# Patient Record
Sex: Female | Born: 1955 | Race: White | Hispanic: No | Marital: Married | Smoking: Former smoker
Health system: Southern US, Community
[De-identification: ages and names within clinical notes are randomized; demographics above are authoritative.]

## PROBLEM LIST (undated history)

## (undated) DIAGNOSIS — K219 Gastro-esophageal reflux disease without esophagitis: Secondary | ICD-10-CM

## (undated) DIAGNOSIS — R079 Chest pain, unspecified: Secondary | ICD-10-CM

## (undated) DIAGNOSIS — E663 Overweight: Secondary | ICD-10-CM

## (undated) DIAGNOSIS — Z79899 Other long term (current) drug therapy: Secondary | ICD-10-CM

## (undated) DIAGNOSIS — E785 Hyperlipidemia, unspecified: Secondary | ICD-10-CM

## (undated) DIAGNOSIS — Z720 Tobacco use: Secondary | ICD-10-CM

## (undated) DIAGNOSIS — I2510 Atherosclerotic heart disease of native coronary artery without angina pectoris: Secondary | ICD-10-CM

## (undated) DIAGNOSIS — I714 Abdominal aortic aneurysm, without rupture: Secondary | ICD-10-CM

## (undated) DIAGNOSIS — E109 Type 1 diabetes mellitus without complications: Secondary | ICD-10-CM

## (undated) DIAGNOSIS — R32 Unspecified urinary incontinence: Secondary | ICD-10-CM

## (undated) DIAGNOSIS — I10 Essential (primary) hypertension: Secondary | ICD-10-CM

## (undated) DIAGNOSIS — J449 Chronic obstructive pulmonary disease, unspecified: Secondary | ICD-10-CM

## (undated) DIAGNOSIS — Z9989 Dependence on other enabling machines and devices: Secondary | ICD-10-CM

## (undated) DIAGNOSIS — E669 Obesity, unspecified: Secondary | ICD-10-CM

## (undated) DIAGNOSIS — E538 Deficiency of other specified B group vitamins: Secondary | ICD-10-CM

## (undated) DIAGNOSIS — E039 Hypothyroidism, unspecified: Secondary | ICD-10-CM

## (undated) DIAGNOSIS — R5383 Other fatigue: Secondary | ICD-10-CM

## (undated) DIAGNOSIS — G2581 Restless legs syndrome: Secondary | ICD-10-CM

## (undated) DIAGNOSIS — F17200 Nicotine dependence, unspecified, uncomplicated: Secondary | ICD-10-CM

## (undated) DIAGNOSIS — J45909 Unspecified asthma, uncomplicated: Secondary | ICD-10-CM

## (undated) DIAGNOSIS — F341 Dysthymic disorder: Secondary | ICD-10-CM

## (undated) DIAGNOSIS — E559 Vitamin D deficiency, unspecified: Secondary | ICD-10-CM

## (undated) DIAGNOSIS — F330 Major depressive disorder, recurrent, mild: Secondary | ICD-10-CM

## (undated) DIAGNOSIS — Z794 Long term (current) use of insulin: Secondary | ICD-10-CM

## (undated) DIAGNOSIS — H6983 Other specified disorders of Eustachian tube, bilateral: Secondary | ICD-10-CM

## (undated) DIAGNOSIS — H60543 Acute eczematoid otitis externa, bilateral: Secondary | ICD-10-CM

## (undated) DIAGNOSIS — M159 Polyosteoarthritis, unspecified: Secondary | ICD-10-CM

## (undated) DIAGNOSIS — E1143 Type 2 diabetes mellitus with diabetic autonomic (poly)neuropathy: Secondary | ICD-10-CM

## (undated) DIAGNOSIS — G4733 Obstructive sleep apnea (adult) (pediatric): Secondary | ICD-10-CM

## (undated) DIAGNOSIS — M5137 Other intervertebral disc degeneration, lumbosacral region: Secondary | ICD-10-CM

## (undated) HISTORY — DX: Vitamin D deficiency, unspecified: E55.9

## (undated) HISTORY — DX: Obstructive sleep apnea (adult) (pediatric): G47.33

## (undated) HISTORY — DX: Other specified disorders of eustachian tube, bilateral: H69.83

## (undated) HISTORY — DX: Type 2 diabetes mellitus with diabetic autonomic (poly)neuropathy: E11.43

## (undated) HISTORY — PX: CHOLECYSTECTOMY: SHX55

## (undated) HISTORY — PX: ENDOSCOPIC HEMILAMINOTOMY W/ DISCECTOMY LUMBAR: SUR439

## (undated) HISTORY — DX: Dysthymic disorder: F34.1

## (undated) HISTORY — DX: Acute eczematoid otitis externa, bilateral: H60.543

## (undated) HISTORY — DX: Chest pain, unspecified: R07.9

## (undated) HISTORY — DX: Other long term (current) drug therapy: Z79.899

## (undated) HISTORY — DX: Restless legs syndrome: G25.81

## (undated) HISTORY — DX: Other fatigue: R53.83

## (undated) HISTORY — DX: Hypercalcemia: E83.52

## (undated) HISTORY — DX: Gastro-esophageal reflux disease without esophagitis: K21.9

## (undated) HISTORY — DX: Essential (primary) hypertension: I10

## (undated) HISTORY — DX: Abdominal aortic aneurysm, without rupture: I71.4

## (undated) HISTORY — DX: Major depressive disorder, recurrent, mild: F33.0

## (undated) HISTORY — DX: Unspecified urinary incontinence: R32

## (undated) HISTORY — DX: Polyosteoarthritis, unspecified: M15.9

## (undated) HISTORY — DX: Other intervertebral disc degeneration, lumbosacral region: M51.37

## (undated) HISTORY — DX: Hypothyroidism, unspecified: E03.9

## (undated) HISTORY — DX: Nicotine dependence, unspecified, uncomplicated: F17.200

## (undated) HISTORY — DX: Unspecified asthma, uncomplicated: J45.909

## (undated) HISTORY — PX: TUBAL LIGATION: SHX77

## (undated) HISTORY — DX: Hyperlipidemia, unspecified: E78.5

## (undated) HISTORY — DX: Dependence on other enabling machines and devices: Z99.89

## (undated) HISTORY — DX: Type 1 diabetes mellitus without complications: E10.9

## (undated) HISTORY — DX: Deficiency of other specified B group vitamins: E53.8

## (undated) HISTORY — DX: Obesity, unspecified: E66.9

## (undated) HISTORY — DX: Long term (current) use of insulin: Z79.4

## (undated) HISTORY — DX: Atherosclerotic heart disease of native coronary artery without angina pectoris: I25.10

## (undated) HISTORY — DX: Overweight: E66.3

## (undated) HISTORY — DX: Tobacco use: Z72.0

## (undated) HISTORY — DX: Chronic obstructive pulmonary disease, unspecified: J44.9

---

## 2002-09-16 ENCOUNTER — Inpatient Hospital Stay (HOSPITAL_COMMUNITY): Admission: EM | Admit: 2002-09-16 | Discharge: 2002-09-18 | Payer: Self-pay | Admitting: *Deleted

## 2002-09-23 ENCOUNTER — Encounter: Payer: Self-pay | Admitting: Emergency Medicine

## 2002-09-23 ENCOUNTER — Emergency Department (HOSPITAL_COMMUNITY): Admission: EM | Admit: 2002-09-23 | Discharge: 2002-09-23 | Payer: Self-pay | Admitting: Emergency Medicine

## 2003-09-04 ENCOUNTER — Inpatient Hospital Stay (HOSPITAL_COMMUNITY): Admission: EM | Admit: 2003-09-04 | Discharge: 2003-09-07 | Payer: Self-pay | Admitting: Emergency Medicine

## 2003-10-02 ENCOUNTER — Inpatient Hospital Stay (HOSPITAL_COMMUNITY): Admission: EM | Admit: 2003-10-02 | Discharge: 2003-10-04 | Payer: Self-pay

## 2004-11-03 ENCOUNTER — Ambulatory Visit: Payer: Self-pay | Admitting: *Deleted

## 2005-05-04 ENCOUNTER — Ambulatory Visit: Payer: Self-pay

## 2005-05-25 ENCOUNTER — Ambulatory Visit: Payer: Self-pay | Admitting: Cardiology

## 2006-11-18 ENCOUNTER — Ambulatory Visit: Payer: Self-pay | Admitting: Cardiology

## 2006-11-18 ENCOUNTER — Emergency Department (HOSPITAL_COMMUNITY): Admission: EM | Admit: 2006-11-18 | Discharge: 2006-11-18 | Payer: Self-pay | Admitting: Emergency Medicine

## 2006-11-24 ENCOUNTER — Ambulatory Visit: Payer: Self-pay

## 2006-11-25 ENCOUNTER — Ambulatory Visit: Payer: Self-pay

## 2006-12-02 ENCOUNTER — Ambulatory Visit: Payer: Self-pay | Admitting: Cardiovascular Disease

## 2007-03-28 DIAGNOSIS — I10 Essential (primary) hypertension: Secondary | ICD-10-CM | POA: Insufficient documentation

## 2007-03-28 DIAGNOSIS — E109 Type 1 diabetes mellitus without complications: Secondary | ICD-10-CM

## 2007-03-28 DIAGNOSIS — E785 Hyperlipidemia, unspecified: Secondary | ICD-10-CM

## 2007-03-28 DIAGNOSIS — I2510 Atherosclerotic heart disease of native coronary artery without angina pectoris: Secondary | ICD-10-CM

## 2007-03-28 DIAGNOSIS — E039 Hypothyroidism, unspecified: Secondary | ICD-10-CM

## 2007-03-28 HISTORY — DX: Type 1 diabetes mellitus without complications: E10.9

## 2007-03-28 HISTORY — DX: Atherosclerotic heart disease of native coronary artery without angina pectoris: I25.10

## 2007-03-28 HISTORY — DX: Hypothyroidism, unspecified: E03.9

## 2007-03-28 HISTORY — DX: Essential (primary) hypertension: I10

## 2007-03-28 HISTORY — DX: Hyperlipidemia, unspecified: E78.5

## 2009-08-27 HISTORY — PX: CORONARY ANGIOPLASTY WITH STENT PLACEMENT: SHX49

## 2009-08-27 HISTORY — PX: CARDIAC CATHETERIZATION: SHX172

## 2009-09-02 ENCOUNTER — Ambulatory Visit: Payer: Self-pay | Admitting: Cardiology

## 2009-09-02 ENCOUNTER — Inpatient Hospital Stay (HOSPITAL_COMMUNITY): Admission: EM | Admit: 2009-09-02 | Discharge: 2009-09-04 | Payer: Self-pay | Admitting: Cardiology

## 2009-09-16 ENCOUNTER — Telehealth (INDEPENDENT_AMBULATORY_CARE_PROVIDER_SITE_OTHER): Payer: Self-pay | Admitting: *Deleted

## 2009-09-17 ENCOUNTER — Ambulatory Visit: Payer: Self-pay | Admitting: Cardiology

## 2009-09-17 ENCOUNTER — Ambulatory Visit: Payer: Self-pay

## 2009-09-17 ENCOUNTER — Encounter (HOSPITAL_COMMUNITY): Admission: RE | Admit: 2009-09-17 | Discharge: 2009-11-27 | Payer: Self-pay | Admitting: Cardiovascular Disease

## 2009-09-18 ENCOUNTER — Encounter: Payer: Self-pay | Admitting: Cardiology

## 2009-09-18 ENCOUNTER — Ambulatory Visit: Payer: Self-pay | Admitting: Internal Medicine

## 2009-09-18 DIAGNOSIS — E663 Overweight: Secondary | ICD-10-CM

## 2009-09-18 DIAGNOSIS — R079 Chest pain, unspecified: Secondary | ICD-10-CM

## 2009-09-18 HISTORY — DX: Chest pain, unspecified: R07.9

## 2009-09-18 HISTORY — DX: Overweight: E66.3

## 2009-10-31 DIAGNOSIS — Z720 Tobacco use: Secondary | ICD-10-CM

## 2009-10-31 DIAGNOSIS — F17200 Nicotine dependence, unspecified, uncomplicated: Secondary | ICD-10-CM

## 2009-10-31 DIAGNOSIS — K219 Gastro-esophageal reflux disease without esophagitis: Secondary | ICD-10-CM | POA: Insufficient documentation

## 2009-10-31 HISTORY — DX: Tobacco use: Z72.0

## 2009-10-31 HISTORY — DX: Gastro-esophageal reflux disease without esophagitis: K21.9

## 2009-10-31 HISTORY — DX: Nicotine dependence, unspecified, uncomplicated: F17.200

## 2009-11-15 ENCOUNTER — Telehealth (INDEPENDENT_AMBULATORY_CARE_PROVIDER_SITE_OTHER): Payer: Self-pay | Admitting: *Deleted

## 2009-12-04 ENCOUNTER — Telehealth (INDEPENDENT_AMBULATORY_CARE_PROVIDER_SITE_OTHER): Payer: Self-pay | Admitting: *Deleted

## 2009-12-04 ENCOUNTER — Ambulatory Visit: Payer: Self-pay | Admitting: Cardiovascular Disease

## 2009-12-10 ENCOUNTER — Telehealth: Payer: Self-pay | Admitting: Cardiovascular Disease

## 2009-12-12 ENCOUNTER — Telehealth: Payer: Self-pay | Admitting: Cardiovascular Disease

## 2009-12-20 ENCOUNTER — Telehealth: Payer: Self-pay | Admitting: Cardiovascular Disease

## 2010-04-18 ENCOUNTER — Ambulatory Visit: Payer: Self-pay | Admitting: Cardiovascular Disease

## 2010-05-13 ENCOUNTER — Telehealth: Payer: Self-pay | Admitting: Cardiovascular Disease

## 2010-07-29 NOTE — Progress Notes (Signed)
Summary: Nuclear pre procedure  Phone Note Outgoing Call Call back at [facility] Phone [phone]   Call placed by: Rea College, CMA,  September 16, 2009 3:45 PM Call placed to: Patient Summary of Call: Attempted to call patient with instructions for myoview, but their phone voicemail has not been set up yet.

## 2010-07-29 NOTE — Assessment & Plan Note (Signed)
Summary: Cardiology Nuclear Study  Nuclear Med Background Indications for Stress Test: Evaluation for Ischemia, Stent Patency, PTCA Patency, Post Hospital  Indications Comments: 09/02/09-09/04/09 CP, NSTEMI>PTCA/Stent-RCA.  History: Angioplasty, Heart Catheterization, Myocardial Infarction, Myocardial Perfusion Study, Stents  History Comments: '04 IPWMI>PTCA/Stent-RCA x 3; '08 MPS: normal @ only 78% of PMHR, EF=76%; 09/03/09 PTAC/Stent-RCA, EF=60%; h/o OSA  Symptoms: Chest Pain, Diaphoresis, Dizziness, DOE, Fatigue, Palpitations, Rapid HR, SOB  Symptoms Comments: Last episode of ZO:XWRUEAVW, now, 1/10.   Nuclear Pre-Procedure Cardiac Risk Factors: Family History - CAD, History of Smoking, Hypertension, IDDM Type 1, Lipids, Obesity Caffeine/Decaff Intake: None NPO After: 8:00 PM Lungs: Clear.  O2 Sat 96% on RA. IV 0.9% NS with Angio Cath: 20g     IV Site: (R) AC IV Started by: Irean Hong RN Chest Size (in) 44     Cup Size DD     Height (in): 64.5 Weight (lb): 225 BMI: 38.16 Tech Comments: Patient recently quit smoking x 2 weeks  Nuclear Med Study 1 or 2 day study:  1 day     Stress Test Type:  Eugenie Birks Reading MD:  Willa Rough, MD     Referring MD:  Tonny Bollman, MD Resting Radionuclide:  Technetium 75m Tetrofosmin     Resting Radionuclide Dose:  11 mCi  Stress Radionuclide:  Technetium 34m Tetrofosmin     Stress Radionuclide Dose:  33 mCi   Stress Protocol   Lexiscan: 0.4 mg   Stress Test Technologist:  Rea College CMA-N     Nuclear Technologist:  Domenic Polite CNMT  Rest Procedure  Myocardial perfusion imaging was performed at rest 45 minutes following the intravenous administration of Myoview Technetium 74m Tetrofosmin.  Stress Procedure  The patient received IV Lexiscan 0.4 mg over 15-seconds.  Myoview injected at 30-seconds.  There were no significant changes with infusion.  She did c/o neck tightness with lexiscan.  Quantitative spect images were obtained after a  45 minute delay.  QPS Raw Data Images:  Normal; no motion artifact; normal heart/lung ratio. Stress Images:  There is normal uptake in all areas. Rest Images:  Normal homogeneous uptake in all areas of the myocardium. Subtraction (SDS):  No evidence of ischemia. Transient Ischemic Dilatation:  1.06  (Normal <1.22)  Lung/Heart Ratio:  .38  (Normal <0.45)  Quantitative Gated Spect Images QGS EDV:  82 ml QGS ESV:  29 ml QGS EF:  65 % QGS cine images:  Normal motion  Findings Normal nuclear study      Overall Impression  Exercise Capacity: Lexiscan BP Response: Normal blood pressure response. Clinical Symptoms: neck tight ECG Impression: No significant ST segment change suggestive of ischemia. Overall Impression: Normal stress nuclear study.  Appended Document: Cardiology Nuclear Study please notify pt of normal result. thx.  Appended Document: Cardiology Nuclear Study Pt was seen by the PA on 09/18/09.  Herma Carson reviewed results with pt at that time.

## 2010-07-29 NOTE — Progress Notes (Signed)
Summary: pres refill  Phone Note Call from Patient   Summary of Call: Dr. Freida Busman increased her Lasix and she needs a new [facility] [address] Initial call taken by: Park Breed,  December 12, 2009 3:54 PM  Follow-up for Phone Call        Rx Called In:  Lasix 40mg  two times a day [facility] [address] Rosalita Levan per patient's request after med was increased by Dr. Freida Busman. Follow-up by: Dessie Coma,  December 12, 2009 4:01 PM

## 2010-07-29 NOTE — Assessment & Plan Note (Signed)
Summary: eph   Visit Type:  Follow-up  CC:  sob and chest pain.  History of Present Illness: This is a a 55 yr. old white female patient who had a recent hospitalization for  ST segment elevation MI September 02, 2009. She underwent drug-eluting stent to the distal RCA for in-stent restenosis. She had a residual 70% LAD and normal LV function. Prior to that she had a bare-metal stent placed to the mid distal RCA in 2005. She had been on Plavix and had PTY12 inhibition assay, and was found to have zero platelet inhibition on Plavix. For this reason, she was switched to Effient.  yesterday she had a Lexus scan that was completely normal.  Since she's been home she complains of a soreness in her chest that is chronic and doesn't go away. She, says it's not the same pain that brought her into the hospital. She has had worsening depression and these medications have been adjusted. She has also been unable to start cardiac rehabilitation for various reasons and has a trip planned to [place], for her son's wedding. she complains of dyspnea on exertion as well.    Current Medications (verified): 1)  Aspirin 325 Mg  Tbec (Aspirin) .... Take 1 By Mouth Qd 2)  Multivitamins   Tabs (Multiple Vitamin) .... Take 1 By Mouth Qd 3)  Spironolactone-Hctz 25-25 Mg  Tabs (Spironolactone-Hctz) .... Take 11/2 Tab By Mouth Qd 4)  Cymbalta 60 Mg  Cpep (Duloxetine Hcl) .... Take 1 By Mouth Two Times A Day 5)  Toprol Xl 100 Mg  Tb24 (Metoprolol Succinate) .... Take 1 By Mouth Qd 6)  Lipitor 80 Mg  Tabs (Atorvastatin Calcium) .... Take 1 By Mouth Qd 7)  Furosemide 20 Mg  Tabs (Furosemide) .... Take 1 Two Times A Day 8)  Fenofibrate 54 Mg Tabs (Fenofibrate) .... Take One Daily 9)  Omeprazole 20 Mg Cpdr (Omeprazole) .... Take Two Two Times A Day 10)  Lantus 100 Unit/ml  Soln (Insulin Glargine) .... See Office Notes 11)  Folic Acid 200 Mcg  Tabs (Folic Acid) .... Take 1 By Mouth Qd 12)  Synthroid 100 Mcg  Tabs (Levothyroxine  Sodium) .... Take 1 By Mouth Qd 13)  Synthroid 88 Mcg  Tabs (Levothyroxine Sodium) .... Take 1 By Mouth Qd 14)  Est Estrogens-Methyltest 1.25-2.5 Mg Tabs (Est Estrogens-Methyltest) .... Take One Daily 15)  Medroxyprogesterone Acetate 10 Mg Tabs (Medroxyprogesterone Acetate) .... Take One Daily 16)  Clonazepam 0.5 Mg Tabs (Clonazepam) .... Take 1 1/2 in Evening 17)  Tylenol Arthritis Pain 650 Mg Cr-Tabs (Acetaminophen) .... Take Two in The Evening 18)  Nitrostat 0.4 Mg Subl (Nitroglycerin) .... Take As Needed 19)  Lantus 100 Unit/ml Soln (Insulin Glargine) .... Take As Directed 20)  Vitamin D 1000 Unit Tabs (Cholecalciferol) .... Take Two Daily 21)  Effient 10 Mg Tabs (Prasugrel Hcl) .... Take One Daily 22)  Abilify 2 Mg Tabs (Aripiprazole) .... Take One Daily 23)  Triamcinolone Acetonide 0.1 % Crea (Triamcinolone Acetonide) .... Apply Gently To Area 3 To 4 Times Daily 24)  Proair Hfa 108 (90 Base) Mcg/act Aers (Albuterol Sulfate) .... 2 Puffs Every 4 Hours Prn 25)  Hydrocodone-Acetaminophen 7.5-500 Mg Tabs (Hydrocodone-Acetaminophen) .... Take Every 4-6 Hours As Needed  Allergies: 1)  ! Sulfa 2)  ! Amoxicillin 3)  ! Codeine  Past History:  Past Medical History: Last updated: 03/28/2007 Coronary artery disease Diabetes mellitus, type I Hyperlipidemia Hypertension Hypothyroidism  Review of Systems  see the history of present illness  Vital Signs:  Patient profile:   55 year old female Height:      64.5 inches Weight:      224 pounds Pulse rate:   96 / minute Pulse rhythm:   regular BP sitting:   128 / 76  (left arm)  Vitals Entered By: Jacquelin Hawking, CMA (September 18, 2009 1:32 PM)  Physical Exam  General:   Well-nournished, in no acute distress. Neck: No JVD, HJR, Bruit, or thyroid enlargement Lungs: No tachypnea, clear without wheezing, rales, or rhonchi Cardiovascular: RRR, PMI not displaced, heart sounds normal, no murmurs, gallops, bruit, thrill, or  heave. Abdomen: BS normal. Soft without organomegaly, masses, lesions or tenderness. Extremities: left radial pulse, without hematoma or hemorrhage. She has good brachial pulses, lower extremities without cyanosis, clubbing or edema. Good distal pulses bilateral SKin: Warm, no lesions or rashes  Musculoskeletal: No deformities Neuro: no focal signs    Impression & Recommendations:  Problem # 1:  CORONARY ARTERY DISEASE (ICD-414.00) Patient suffered a non-ST elevation MI September 02, 2009 and underwent successful stenting of the distal RCA for in-stent restenosis. She has residual 70% LAD and normal LV function. Axis scan yesterday was normal without ischemia. She did have 0%, platelet inhibition on Plavix and was therefore, switched to Effient. She has ongoing chest soreness that is continuous, but different from admission pain. I encouraged her cardiac rehabilitation and weight loss. The following medications were removed from the medication list:    Plavix 75 Mg Tabs (Clopidogrel bisulfate) .Marland Kitchen... Take 1 by mouth qd Her updated medication list for this problem includes:    Aspirin 325 Mg Tbec (Aspirin) .Marland Kitchen... Take 1 by mouth qd    Toprol Xl 100 Mg Tb24 (Metoprolol succinate) .Marland Kitchen... Take 1 by mouth qd    Nitrostat 0.4 Mg Subl (Nitroglycerin) .Marland Kitchen... Take as needed    Effient 10 Mg Tabs (Prasugrel hcl) .Marland Kitchen... Take one daily  Problem # 2:  HYPERTENSION (ICD-401.9) blood pressure is controlled Her updated medication list for this problem includes:    Aspirin 325 Mg Tbec (Aspirin) .Marland Kitchen... Take 1 by mouth qd    Spironolactone-hctz 25-25 Mg Tabs (Spironolactone-hctz) .Marland Kitchen... Take 11/2 tab by mouth qd    Toprol Xl 100 Mg Tb24 (Metoprolol succinate) .Marland Kitchen... Take 1 by mouth qd    Furosemide 20 Mg Tabs (Furosemide) .Marland Kitchen... Take 1 two times a day  Patient Instructions: 1)  Your physician recommends that you schedule a follow-up appointment in: 1 month with Dr. Excell Seltzer. 2)  Encouraged cardiac re-habilitation.

## 2010-07-29 NOTE — Progress Notes (Signed)
----   Converted from flag ---- ---- 12/04/2009 1:40 PM, Marilynne Halsted, CMA, AAMA wrote: PT HAS [employer] STANDARD, NO PRECERT REQUIRED.  ---- 12/04/2009 1:36 PM, Dessie Coma wrote: Echo scheduled at [facility]. for Mon. 12/09/09 with Dx: [phone] Thanks, Victorino Dike ------------------------------

## 2010-07-29 NOTE — Progress Notes (Signed)
  Phone Note Outgoing Call   Call placed by: Dessie Coma,  December 20, 2009 11:15 AM Call placed to: Patient Summary of Call: Patient notified per Dr. Freida Busman, Echo was normal.

## 2010-07-29 NOTE — Progress Notes (Signed)
Summary: MED REFILL  Phone Note Call from Patient   Summary of Call: PT NEEDS REFILLS [facility] [address]. Initial call taken by: Park Breed,  May 13, 2010 3:18 PM  Follow-up for Phone Call        Rx Called In Follow-up by: Dessie Coma  LPN,  May 13, 2010 3:38 PM

## 2010-07-29 NOTE — Progress Notes (Signed)
  Request recieved from DDS sent to [facility]  Nov 15, 2009 8:17 AM

## 2010-07-29 NOTE — Progress Notes (Signed)
  Phone Note Outgoing Call   Call placed by: Dessie Coma,  December 10, 2009 9:30 AM Summary of Call: T.C. to patient-notified her per Dr. Freida Busman, to increase her lasix to 40mg  by mouth two times a day. Triglycerides still too high.  Need to continue to work on diet.

## 2010-09-21 LAB — CBC
HCT: 38.4 % (ref 36.0–46.0)
HCT: 40.2 % (ref 36.0–46.0)
HCT: 42 % (ref 36.0–46.0)
Hemoglobin: 13.3 g/dL (ref 12.0–15.0)
Hemoglobin: 13.8 g/dL (ref 12.0–15.0)
Hemoglobin: 14.5 g/dL (ref 12.0–15.0)
MCHC: 34.4 g/dL (ref 30.0–36.0)
MCHC: 34.5 g/dL (ref 30.0–36.0)
MCHC: 34.8 g/dL (ref 30.0–36.0)
MCV: 96 fL (ref 78.0–100.0)
MCV: 96.3 fL (ref 78.0–100.0)
MCV: 96.5 fL (ref 78.0–100.0)
Platelets: 218 10*3/uL (ref 150–400)
Platelets: 230 10*3/uL (ref 150–400)
Platelets: 260 10*3/uL (ref 150–400)
RBC: 3.98 MIL/uL (ref 3.87–5.11)
RBC: 4.18 MIL/uL (ref 3.87–5.11)
RBC: 4.38 MIL/uL (ref 3.87–5.11)
RDW: 14.3 % (ref 11.5–15.5)
RDW: 14.4 % (ref 11.5–15.5)
RDW: 14.5 % (ref 11.5–15.5)
WBC: 13.7 10*3/uL — ABNORMAL HIGH (ref 4.0–10.5)
WBC: 14.3 10*3/uL — ABNORMAL HIGH (ref 4.0–10.5)
WBC: 9.4 10*3/uL (ref 4.0–10.5)

## 2010-09-21 LAB — GLUCOSE, CAPILLARY
Glucose-Capillary: 164 mg/dL — ABNORMAL HIGH (ref 70–99)
Glucose-Capillary: 175 mg/dL — ABNORMAL HIGH (ref 70–99)
Glucose-Capillary: 186 mg/dL — ABNORMAL HIGH (ref 70–99)
Glucose-Capillary: 194 mg/dL — ABNORMAL HIGH (ref 70–99)
Glucose-Capillary: 208 mg/dL — ABNORMAL HIGH (ref 70–99)
Glucose-Capillary: 227 mg/dL — ABNORMAL HIGH (ref 70–99)
Glucose-Capillary: 236 mg/dL — ABNORMAL HIGH (ref 70–99)
Glucose-Capillary: 278 mg/dL — ABNORMAL HIGH (ref 70–99)

## 2010-09-21 LAB — CARDIAC PANEL(CRET KIN+CKTOT+MB+TROPI)
CK, MB: 1.7 ng/mL (ref 0.3–4.0)
CK, MB: 2.3 ng/mL (ref 0.3–4.0)
CK, MB: 2.6 ng/mL (ref 0.3–4.0)
Relative Index: INVALID (ref 0.0–2.5)
Relative Index: INVALID (ref 0.0–2.5)
Relative Index: INVALID (ref 0.0–2.5)
Total CK: 46 U/L (ref 7–177)
Total CK: 50 U/L (ref 7–177)
Total CK: 57 U/L (ref 7–177)
Troponin I: 0.2 ng/mL — ABNORMAL HIGH (ref 0.00–0.06)
Troponin I: 0.24 ng/mL — ABNORMAL HIGH (ref 0.00–0.06)
Troponin I: 0.39 ng/mL — ABNORMAL HIGH (ref 0.00–0.06)

## 2010-09-21 LAB — HEMOGLOBIN A1C
Hgb A1c MFr Bld: 10 % — ABNORMAL HIGH (ref 4.6–6.1)
Mean Plasma Glucose: 240 mg/dL

## 2010-09-21 LAB — PLATELET INHIBITION P2Y12
P2Y12 % Inhibition: 0 %
Platelet Function  P2Y12: 274 [PRU] (ref 194–418)
Platelet Function Baseline: 261 [PRU] (ref 194–418)

## 2010-09-21 LAB — MRSA PCR SCREENING
MRSA by PCR: NEGATIVE
MRSA by PCR: NEGATIVE

## 2010-09-21 LAB — TSH: TSH: 5.978 u[IU]/mL — ABNORMAL HIGH (ref 0.350–4.500)

## 2010-09-21 LAB — BASIC METABOLIC PANEL
BUN: 7 mg/dL (ref 6–23)
CO2: 28 mEq/L (ref 19–32)
Calcium: 8.8 mg/dL (ref 8.4–10.5)
Chloride: 104 mEq/L (ref 96–112)
Creatinine, Ser: 0.74 mg/dL (ref 0.4–1.2)
GFR calc Af Amer: >60 mL/min (ref >60)
GFR calc non Af Amer: >60 mL/min (ref >60)
Glucose, Bld: 167 mg/dL — ABNORMAL HIGH (ref 70–99)
Potassium: 3.4 mEq/L — ABNORMAL LOW (ref 3.5–5.1)
Sodium: 139 mEq/L (ref 135–145)

## 2010-09-21 LAB — LIPID PANEL
Cholesterol: 154 mg/dL (ref 0–200)
HDL: 26 mg/dL — ABNORMAL LOW (ref >39)
LDL Cholesterol: UNDETERMINED mg/dL (ref 0–99)
Total CHOL/HDL Ratio: 5.9 RATIO
Triglycerides: 658 mg/dL — ABNORMAL HIGH (ref <150)
VLDL: UNDETERMINED mg/dL (ref 0–40)

## 2010-09-21 LAB — HEPARIN LEVEL (UNFRACTIONATED)
Heparin Unfractionated: 0.1 IU/mL — ABNORMAL LOW (ref 0.30–0.70)
Heparin Unfractionated: 0.19 IU/mL — ABNORMAL LOW (ref 0.30–0.70)

## 2010-09-21 LAB — PLATELET COUNT: Platelets: 234 10*3/uL (ref 150–400)

## 2010-09-21 LAB — BRAIN NATRIURETIC PEPTIDE: Pro B Natriuretic peptide (BNP): 49 pg/mL (ref 0.0–100.0)

## 2010-09-24 ENCOUNTER — Telehealth: Payer: Self-pay | Admitting: *Deleted

## 2010-09-24 NOTE — Telephone Encounter (Signed)
Patient stopped by office-states was diagnosed with an AAA by her orthopedist.  4.2cm-wants to be seen.  First available appt. 10/06/10-will notify Dr. Kirke Corin to see if patient can wait til then.

## 2010-09-26 NOTE — Telephone Encounter (Signed)
Yes that is fine. It is not a life threatening situation.

## 2010-09-26 NOTE — Telephone Encounter (Signed)
T.C. To patient-notified her,per Dr. Kirke Corin, OK to wait until appointment in April-it is not a life threatening situation.

## 2010-10-01 ENCOUNTER — Encounter: Payer: Self-pay | Admitting: Cardiovascular Disease

## 2010-10-06 ENCOUNTER — Ambulatory Visit: Payer: Self-pay | Admitting: Cardiovascular Disease

## 2010-10-23 ENCOUNTER — Emergency Department (HOSPITAL_COMMUNITY)

## 2010-10-23 ENCOUNTER — Encounter (HOSPITAL_COMMUNITY): Payer: Self-pay | Admitting: Radiology

## 2010-10-23 ENCOUNTER — Inpatient Hospital Stay (HOSPITAL_COMMUNITY)
Admission: EM | Admit: 2010-10-23 | Discharge: 2010-10-24 | DRG: 313 | Disposition: A | Discharge status: Home / Self Care | Source: Ambulatory Visit | Attending: Cardiology | Admitting: Cardiology

## 2010-10-23 DIAGNOSIS — E669 Obesity, unspecified: Secondary | ICD-10-CM | POA: Diagnosis present

## 2010-10-23 DIAGNOSIS — E039 Hypothyroidism, unspecified: Secondary | ICD-10-CM | POA: Diagnosis present

## 2010-10-23 DIAGNOSIS — G4733 Obstructive sleep apnea (adult) (pediatric): Secondary | ICD-10-CM | POA: Diagnosis present

## 2010-10-23 DIAGNOSIS — E785 Hyperlipidemia, unspecified: Secondary | ICD-10-CM | POA: Diagnosis present

## 2010-10-23 DIAGNOSIS — R079 Chest pain, unspecified: Principal | ICD-10-CM | POA: Diagnosis present

## 2010-10-23 DIAGNOSIS — M549 Dorsalgia, unspecified: Secondary | ICD-10-CM | POA: Diagnosis present

## 2010-10-23 DIAGNOSIS — Z7982 Long term (current) use of aspirin: Secondary | ICD-10-CM

## 2010-10-23 DIAGNOSIS — I2510 Atherosclerotic heart disease of native coronary artery without angina pectoris: Secondary | ICD-10-CM | POA: Diagnosis present

## 2010-10-23 DIAGNOSIS — Z794 Long term (current) use of insulin: Secondary | ICD-10-CM

## 2010-10-23 DIAGNOSIS — I252 Old myocardial infarction: Secondary | ICD-10-CM

## 2010-10-23 DIAGNOSIS — I10 Essential (primary) hypertension: Secondary | ICD-10-CM | POA: Diagnosis present

## 2010-10-23 DIAGNOSIS — E119 Type 2 diabetes mellitus without complications: Secondary | ICD-10-CM | POA: Diagnosis present

## 2010-10-23 DIAGNOSIS — K219 Gastro-esophageal reflux disease without esophagitis: Secondary | ICD-10-CM | POA: Diagnosis present

## 2010-10-23 DIAGNOSIS — G2581 Restless legs syndrome: Secondary | ICD-10-CM | POA: Diagnosis present

## 2010-10-23 DIAGNOSIS — Z9861 Coronary angioplasty status: Secondary | ICD-10-CM

## 2010-10-23 DIAGNOSIS — F17200 Nicotine dependence, unspecified, uncomplicated: Secondary | ICD-10-CM | POA: Diagnosis present

## 2010-10-23 DIAGNOSIS — E876 Hypokalemia: Secondary | ICD-10-CM | POA: Diagnosis present

## 2010-10-23 LAB — COMPREHENSIVE METABOLIC PANEL
ALT: 21 U/L (ref 0–35)
AST: 20 U/L (ref 0–37)
Albumin: 4.1 g/dL (ref 3.5–5.2)
Alkaline Phosphatase: 81 U/L (ref 39–117)
BUN: 8 mg/dL (ref 6–23)
CO2: 28 mEq/L (ref 19–32)
Calcium: 9.8 mg/dL (ref 8.4–10.5)
Chloride: 100 mEq/L (ref 96–112)
Creatinine, Ser: 0.98 mg/dL (ref 0.4–1.2)
GFR calc Af Amer: >60 mL/min (ref >60)
GFR calc non Af Amer: 59 mL/min — ABNORMAL LOW (ref >60)
Glucose, Bld: 98 mg/dL (ref 70–99)
Potassium: 3.2 mEq/L — ABNORMAL LOW (ref 3.5–5.1)
Sodium: 140 mEq/L (ref 135–145)
Total Bilirubin: 0.4 mg/dL (ref 0.3–1.2)
Total Protein: 7.2 g/dL (ref 6.0–8.3)

## 2010-10-23 LAB — POCT I-STAT, CHEM 8
BUN: 8 mg/dL (ref 6–23)
Calcium, Ion: 1.09 mmol/L — ABNORMAL LOW (ref 1.12–1.32)
Chloride: 99 mEq/L (ref 96–112)
Creatinine, Ser: 1.1 mg/dL (ref 0.4–1.2)
Glucose, Bld: 120 mg/dL — ABNORMAL HIGH (ref 70–99)
HCT: 44 % (ref 36.0–46.0)
Hemoglobin: 15 g/dL (ref 12.0–15.0)
Potassium: 3.5 mEq/L (ref 3.5–5.1)
Sodium: 139 mEq/L (ref 135–145)
TCO2: 30 mmol/L (ref 0–100)

## 2010-10-23 LAB — POCT CARDIAC MARKERS
CKMB, poc: <1 ng/mL — ABNORMAL LOW (ref 1.0–8.0)
CKMB, poc: 1 ng/mL (ref 1.0–8.0)
Myoglobin, poc: 59.8 ng/mL (ref 12–200)
Myoglobin, poc: 90.7 ng/mL (ref 12–200)
Troponin i, poc: <0.05 ng/mL (ref 0.00–0.09)
Troponin i, poc: <0.05 ng/mL (ref 0.00–0.09)

## 2010-10-23 LAB — DIFFERENTIAL
Basophils Absolute: 0.1 10*3/uL (ref 0.0–0.1)
Basophils Relative: 1 % (ref 0–1)
Eosinophils Absolute: 0.4 10*3/uL (ref 0.0–0.7)
Eosinophils Relative: 3 % (ref 0–5)
Lymphocytes Relative: 17 % (ref 12–46)
Lymphs Abs: 2.7 10*3/uL (ref 0.7–4.0)
Monocytes Absolute: 0.7 10*3/uL (ref 0.1–1.0)
Monocytes Relative: 5 % (ref 3–12)
Neutro Abs: 11.8 10*3/uL — ABNORMAL HIGH (ref 1.7–7.7)
Neutrophils Relative %: 75 % (ref 43–77)

## 2010-10-23 LAB — HEPATIC FUNCTION PANEL
ALT: 25 U/L (ref 0–35)
AST: 23 U/L (ref 0–37)
Albumin: 3.8 g/dL (ref 3.5–5.2)
Alkaline Phosphatase: 81 U/L (ref 39–117)
Bilirubin, Direct: 0.1 mg/dL (ref 0.0–0.3)
Indirect Bilirubin: 0.2 mg/dL — ABNORMAL LOW (ref 0.3–0.9)
Total Bilirubin: 0.3 mg/dL (ref 0.3–1.2)
Total Protein: 6.7 g/dL (ref 6.0–8.3)

## 2010-10-23 LAB — TROPONIN I: Troponin I: 0.02 ng/mL (ref 0.00–0.06)

## 2010-10-23 LAB — LIPASE, BLOOD: Lipase: 24 U/L (ref 11–59)

## 2010-10-23 LAB — CBC
HCT: 43.7 % (ref 36.0–46.0)
Hemoglobin: 14.7 g/dL (ref 12.0–15.0)
MCH: 32.3 pg (ref 26.0–34.0)
MCHC: 33.6 g/dL (ref 30.0–36.0)
MCV: 96 fL (ref 78.0–100.0)
Platelets: 354 10*3/uL (ref 150–400)
RBC: 4.55 MIL/uL (ref 3.87–5.11)
RDW: 15.2 % (ref 11.5–15.5)
WBC: 15.7 10*3/uL — ABNORMAL HIGH (ref 4.0–10.5)

## 2010-10-23 LAB — BRAIN NATRIURETIC PEPTIDE: Pro B Natriuretic peptide (BNP): 44 pg/mL (ref 0.0–100.0)

## 2010-10-23 LAB — CK TOTAL AND CKMB (NOT AT ARMC)
CK, MB: 1.8 ng/mL (ref 0.3–4.0)
Relative Index: INVALID (ref 0.0–2.5)
Total CK: 77 U/L (ref 7–177)

## 2010-10-23 LAB — PROTIME-INR
INR: 0.9 (ref 0.00–1.49)
Prothrombin Time: 12.4 seconds (ref 11.6–15.2)

## 2010-10-23 LAB — AMYLASE: Amylase: 20 U/L (ref 0–105)

## 2010-10-23 MED ORDER — IOHEXOL 350 MG/ML SOLN
100.0000 mL | Freq: Once | INTRAVENOUS | Status: AC | PRN
  Administered 2010-10-23: 100 mL via INTRAVENOUS

## 2010-10-24 LAB — CBC
HCT: 41.5 % (ref 36.0–46.0)
Hemoglobin: 13.8 g/dL (ref 12.0–15.0)
MCH: 31.9 pg (ref 26.0–34.0)
MCHC: 33.3 g/dL (ref 30.0–36.0)
MCV: 96.1 fL (ref 78.0–100.0)
Platelets: 329 10*3/uL (ref 150–400)
RBC: 4.32 MIL/uL (ref 3.87–5.11)
RDW: 15.3 % (ref 11.5–15.5)
WBC: 13 10*3/uL — ABNORMAL HIGH (ref 4.0–10.5)

## 2010-10-24 LAB — PROTIME-INR
INR: 0.94 (ref 0.00–1.49)
Prothrombin Time: 12.8 seconds (ref 11.6–15.2)

## 2010-10-24 LAB — CARDIAC PANEL(CRET KIN+CKTOT+MB+TROPI)
CK, MB: 2.3 ng/mL (ref 0.3–4.0)
Relative Index: INVALID (ref 0.0–2.5)
Total CK: 71 U/L (ref 7–177)
Troponin I: 0.01 ng/mL (ref 0.00–0.06)

## 2010-10-24 LAB — BASIC METABOLIC PANEL
BUN: 9 mg/dL (ref 6–23)
CO2: 28 mEq/L (ref 19–32)
Calcium: 9.5 mg/dL (ref 8.4–10.5)
Chloride: 98 mEq/L (ref 96–112)
Creatinine, Ser: 0.93 mg/dL (ref 0.4–1.2)
GFR calc Af Amer: >60 mL/min (ref >60)
GFR calc non Af Amer: >60 mL/min (ref >60)
Glucose, Bld: 207 mg/dL — ABNORMAL HIGH (ref 70–99)
Potassium: 3.3 mEq/L — ABNORMAL LOW (ref 3.5–5.1)
Sodium: 137 mEq/L (ref 135–145)

## 2010-10-24 LAB — GLUCOSE, CAPILLARY
Glucose-Capillary: 189 mg/dL — ABNORMAL HIGH (ref 70–99)
Glucose-Capillary: 171 mg/dL — ABNORMAL HIGH (ref 70–99)

## 2010-10-24 LAB — MRSA PCR SCREENING: MRSA by PCR: NEGATIVE

## 2010-10-24 LAB — APTT: aPTT: 30 seconds (ref 24–37)

## 2010-10-24 LAB — HEPARIN LEVEL (UNFRACTIONATED): Heparin Unfractionated: <0.1 IU/mL — ABNORMAL LOW (ref 0.30–0.70)

## 2010-10-27 ENCOUNTER — Encounter: Admitting: Cardiovascular Disease

## 2010-10-30 ENCOUNTER — Ambulatory Visit (INDEPENDENT_AMBULATORY_CARE_PROVIDER_SITE_OTHER): Admitting: Cardiovascular Disease

## 2010-10-30 ENCOUNTER — Encounter: Payer: Self-pay | Admitting: Cardiovascular Disease

## 2010-10-30 DIAGNOSIS — Z720 Tobacco use: Secondary | ICD-10-CM

## 2010-10-30 DIAGNOSIS — F17200 Nicotine dependence, unspecified, uncomplicated: Secondary | ICD-10-CM

## 2010-10-30 DIAGNOSIS — I2510 Atherosclerotic heart disease of native coronary artery without angina pectoris: Secondary | ICD-10-CM | POA: Insufficient documentation

## 2010-10-30 DIAGNOSIS — E785 Hyperlipidemia, unspecified: Secondary | ICD-10-CM | POA: Insufficient documentation

## 2010-10-30 NOTE — Patient Instructions (Signed)
Your physician recommends that you schedule a follow-up appointment in: 6 months  

## 2010-10-30 NOTE — Assessment & Plan Note (Signed)
The patient mentioned that she had a lipid profile done during her hospitalization. However, I checked that occurred later none was performed. She will need a fasting lipid profile during her followup visit. She continues to be on Lipitor and fenofibrate.

## 2010-10-30 NOTE — Discharge Summary (Signed)
Deanna Dunn, Deanna Dunn             ACCOUNT NO.:  [id number]  MEDICAL RECORD NO.:  [id number]           PATIENT TYPE:  I  LOCATION:  2609                         FACILITY:  [facility]  PHYSICIAN:  Verne Carrow, MDDATE OF BIRTH:  [birth year]  DATE OF ADMISSION:  10/23/2010 DATE OF DISCHARGE:  10/24/2010                              DISCHARGE SUMMARY   PRIMARY CARDIOLOGIST:  Lorine Bears, MD  DISCHARGE DIAGNOSES: 1. Chest pain, the patient ruled out for myocardial infarction this     admission. 2. Coronary artery disease, status post successful percutaneous     coronary intervention and drug-eluting stent to the distal right     coronary artery secondary to in-stent restenosis, residual 70%     stenosis of the proximal left anterior descending artery.     a.     Status post bare-metal stent to the left anterior descending      artery in 2005.     b.     Status post drug-eluting stent to the right coronary artery      in 2004. 3. Hypertension. 4. Continued tobacco abuse. 5. Hypokalemia, repleted. 6. Obesity.  SECONDARY DIAGNOSES: 1. Obstructive sleep apnea with continuous positive airway pressure. 2. Hypothyroidism. 3. Gastroesophageal reflux disease. 4. Restless legs syndrome. 5. History of pancreatitis.  PROCEDURE/DIAGNOSTICS PERFORMED DURING HOSPITALIZATION: 1. CT angiography of the chest, abdomen and pelvis on October 23, 2010:     Negative for dissection or aneurysm.  There was left renal artery     stenosis estimated at 50-70%.  Fatty infiltration of the liver and     hepatomegaly. 2. Chest x-ray on October 23, 2010:  No acute abnormalities. 3. Abdominal x-ray on October 23, 2010:  Benign-appearing abdomen.  ALLERGIES: 1. SULFA. 2. AMOXICILLIN. 3. AMPICILLIN. 4. ACTOS. 5. NORVASC. 6. CODEINE 7. PICKLES. 8. SAUERKRAUT.  REASON FOR HOSPITALIZATION:  This is a 55 year old female with the above- stated problem list who presented to the emergency department  with complaints of acute onset of sudden stabbing chest pain on the left side that went through to her shoulder blades.  This was associated with belching with foul odor.  She also had nausea as well as diaphoresis. Pain was not relieved with sublingual nitroglycerin and aspirin. Therefore, she presented to the emergency department.  The patient was given Reglan with complete relief of her nausea.  She continued to have chest pressure.  Her point-of-care markers were negative x1.  Her EKG showed nonspecific ST-T-wave changes.  With the patient's shoulder pain, she was taken for CT, angiogram of the chest and abdomen that was negative for aneurysm or dissection.  Cardiology admitted the patient to the step-down unit for further evaluation of possible acute coronary syndrome.  The patient was admitted to the step-down unit, placed on IV nitroglycerin as well as IV heparin, and continued on medication for pain control and nausea.  Of note, the patient did refuse IV nitroglycerin.  She said her pain had eased, but not completely resolved, but was not bad enough for IV nitroglycerin.  She was continued on cardiac medications and kept n.p.o. for possible cardiac catheterization.  The patient did  rule out for myocardial infarction with cardiac enzymes being negative x2.  GI etiology was also ruled out with normal amylase, lipase, and LFTs.  With the patient's residual 70% LAD stenosis per cardiac catheterization in 2011, Dr. Clifton James felt it was  appropriate to repeat cardiac catheterization.  The patient adamantly refused to have relook catheterization.  Risk and benefits were discussed in detail with the patient and she again refused.  At this time, the patient is asking to be discharge.  In the setting of her cardiac enzymes being negative and EKG without acute changes, she will be discharged home with refusal of our recommendation for catheterization.  We will start the patient on Imdur 30  mg daily.  She will have close follow up with Dr. Kirke Corin next week in [place] office for further consideration of catheterization.  Of note, the patient's potassium was 3.3 and therefore has been replaced prior to discharge.  DISCHARGE LABS:  CBC 13, hemoglobin 13.8, hematocrit 41.5, platelets 329.  Sodium 137, potassium 3.3, chloride 98, bicarb 28, BUN 9, creatinine 0.93.  Cardiac enzymes are negative x2.  BNP 44, amylase 44, lipase 24.  LFTs within normal limits.  DISCHARGE MEDICATIONS: 1. Imdur 30 mg one tablet daily. 2. Aspirin 81 mg one tablet daily. 3. Bupropion XL 150 mg two tablets daily. 4. Byetta 5 mcg subcutaneously twice daily with meal. 5. Clonazepam 0.5 mg one tablet daily at bedtime as needed for     restless legs. 6. Clotrimazole troche 10 mg one tablet by mouth five times a day as     needed for yeast infection in mouth. 7. Calcium carbonate 600 mg/vitamin D over-the-counter one tablet     twice daily. 8. Centrum Silver OTC one tablet daily. 9. Clonazepam 0.5 mg one half tablet daily at bedtime. 10.Cymbalta 60 mg one tablet twice daily. 11.Effient 10 mg one tablet daily. 12.Estrogen and methyltestosterone HS 0.625/1.25 mg one tablet daily. 13.Fish oil 1 g one tablet daily. 14.Fenofibrate 160 mg one tablet daily. 15.Folic acid 1 mg one tablet daily. 16.Humalog sliding scale insulin three times a day before meals as     needed. 17.Humalog 28 units subcutaneously three times a day before meals. 18.Lantus SoloSTAR Pen 1000 units/mL 65 units subcutaneously twice     daily. 19.Lasix 40 mg one tablet twice daily. 20.Levothroid 200 mcg one tablet daily. 21.Levothyroxine 88 mcg one tablet daily. 22.Lipitor 80 mg daily. 23.Medroxyprogesterone 10 mg one tablet daily. 24.Metformin XR 500 mg one tablet daily. 25.NicoDerm CQ 21 mg patch one patch daily transdermally. 26.Nitroglycerin sublingual 0.4 mg one tablet under tongue every 5     minutes up to three doses as  needed for chest pain. 27.Omeprazole 20 mg two tablets daily. 28.ProAir inhaler 90 mcg two puffs inhaled every 4 hours as needed for     congestion or shortness of breath. 29.Spironolactone/hydrochlorothiazide 25/25 one and half tablets     daily. 30.Triamcinolone topical 0.1% ointment one application topically four     times daily to affected areas. 31.Toprol-XL 100 mg one tablet daily. 32.Tylenol Arthritis 650 mg two tablets daily at bedtime. 33.Vitamin D3 1000 units one tablet twice daily.  FOLLOWUP PLANS AND INSTRUCTIONS: 1. The patient will follow up with Dr. Kirke Corin in the [facility] office on     October 27, 2010 at 10:30 a.m. 2. The patient is to increase activity slowly. 3. She is to continue low-sodium, heart-healthy diabetic diet. 4. She is to avoid straining and stop any activity that causes chest  and shortness of breath. 5. The patient is to call the office in the interim for any problems     or concerns.  DURATION OF DISCHARGE:  Greater than 30 minutes with physician and physician extender time.     Leonette Monarch, PA-C   ______________________________ Verne Carrow, MD    NB/MEDQ  D:  10/24/2010  T:  10/25/2010  Job:  [id number]  cc:   Lorine Bears, MD  Electronically Signed by Alen Blew P.A. on 10/30/2010 03:05:11 PM Electronically Signed by Verne Carrow MD on 10/30/2010 04:23:24 PM

## 2010-10-30 NOTE — Assessment & Plan Note (Signed)
She is making progress with smoking cessation but continues to smoke a few cigarettes a day.  I advised her to quit smoking completely.

## 2010-10-30 NOTE — Progress Notes (Signed)
HPI  This is a 55 year old female who is here today for a followup visit after a recent hospitalization. She has known history of coronary artery disease. Her most recent cardiac catheterization was in March of 2011. At that time she had significant restenosis and thrombosis in the right coronary artery stent. She underwent an angioplasty and drug-eluting stent placement to the right coronary artery. She was also found to have a 70% stenosis in the mid left anterior descending artery. That was treated medically. She had a followup nuclear stress testing last year which showed no evidence of ischemia. She had a prolonged episode of chest pain last week which was substernal radiating to her back. It lasted for a whole day and was intermittent. It was very similar to her previous myocardial infarction. She was admitted to [facility]. She ruled out for myocardial infarction by serial cardiac enzymes. She also had a CTA done of the chest and abdomen which showed no evidence of aneurysm or dissection. The patient was seen by the consult team and cardiac catheterization was recommended. However, the patient refused and was ultimately discharged home. Since her hospital discharge, the patient has not had any further episodes of chest pain. She does get chronic exertional dyspnea which has not changed. She is trying to quit smoking and she is down to a few cigarettes a day and sometimes does not smoke and certain days.  Allergies  Allergen Reactions  . Actos (Pioglitazone Hydrochloride)   . Amoxicillin   . Ampicillin   . Codeine   . Sulfonamide Derivatives      Current Outpatient Prescriptions on File Prior to Visit  Medication Sig Dispense Refill  . Acetaminophen (TYLENOL ARTHRITIS PAIN PO) Take 650 mg by mouth at bedtime. To take 2 tablets every night at bedtime.       . Albuterol Sulfate (PROAIR HFA IN) Inhale 2 puffs into the lungs every 4 (four) hours as needed.        Marland Kitchen aspirin 81 MG tablet  Take 81 mg by mouth daily.        Marland Kitchen atorvastatin (LIPITOR) 80 MG tablet Take 80 mg by mouth daily.        . calcium carbonate (OS-CAL) 600 MG TABS Take 600 mg by mouth 2 (two) times daily with a meal.        . cholecalciferol (VITAMIN D) 1000 UNITS tablet Take 1,000 Units by mouth 2 (two) times daily.        . clonazePAM (KLONOPIN) 0.5 MG tablet Take 0.75 mg by mouth at bedtime.       . DULoxetine (CYMBALTA) 60 MG capsule Take 60 mg by mouth 2 (two) times daily.        Marland Kitchen EST ESTROGENS-METHYLTEST DS PO Take 1 tablet by mouth daily.        . fenofibrate 160 MG tablet Take 160 mg by mouth daily.        . fish oil-omega-3 fatty acids 1000 MG capsule Take 1 g by mouth 2 (two) times daily.       . folic acid (FOLVITE) 1 MG tablet Take 1 mg by mouth daily.        . furosemide (LASIX) 40 MG tablet Take 40 mg by mouth 2 (two) times daily.        . insulin glargine (LANTUS) 100 UNIT/ML injection Inject 85 Units into the skin at bedtime.        . insulin lispro (HUMALOG) 100 UNIT/ML injection Inject 28  Units into the skin 3 (three) times daily before meals.        . medroxyPROGESTERone (PROVERA) 10 MG tablet Take 10 mg by mouth daily.        . metoprolol (TOPROL-XL) 100 MG 24 hr tablet Take 100 mg by mouth daily.        . Multiple Vitamin (MULTIVITAMIN) capsule Take 1 capsule by mouth daily.        . nitroGLYCERIN (NITROSTAT) 0.4 MG SL tablet Place 0.4 mg under the tongue every 5 (five) minutes as needed.        Marland Kitchen omeprazole (PRILOSEC) 20 MG capsule Take 40 mg by mouth 2 (two) times daily.        Marland Kitchen oxyCODONE-acetaminophen (PERCOCET) 7.5-325 MG per tablet Take 1 tablet by mouth every 6 (six) hours as needed.        . prasugrel (EFFIENT) 10 MG TABS Take 10 mg by mouth daily.        Marland Kitchen spironolactone-hydrochlorothiazide (ALDACTAZIDE) 25-25 MG per tablet Take 1.5 tablets by mouth daily.       Marland Kitchen triamcinolone (KENALOG) 0.5 % cream Apply topically as needed.        Marland Kitchen DISCONTD: levothyroxine (SYNTHROID,  LEVOTHROID) 88 MCG tablet Take 88 mcg by mouth daily.           Past Medical History  Diagnosis Date  . Diabetes mellitus   . Obesity   . GERD (gastroesophageal reflux disease)   . Tobacco user   . Obstructive sleep apnea   . Diastolic heart failure     mild  . Coronary artery disease   . Hyperlipidemia      Past Surgical History  Procedure Date  . Cardiac catheterization 08/2009    instent restenosis and thrombosis in RCA, 70% mid LAD stenosis  . Coronary angioplasty with stent placement 08/2009    RCA: 3.5 X 23 mm Promus DES.     No family history on file.   History   Social History  . Marital Status: Married    Spouse Name: N/A    Number of Children: N/A  . Years of Education: N/A   Occupational History  . Not on file.   Social History Main Topics  . Smoking status: Current Everyday Smoker  . Smokeless tobacco: Not on file  . Alcohol Use:   . Drug Use:   . Sexually Active:    Other Topics Concern  . Not on file   Social History Narrative  . No narrative on file       PHYSICAL EXAM   BP 139/79  Pulse 91  Ht 5\' 4"  (1.626 m)  Wt 221 lb (100.245 kg)  BMI 37.93 kg/m2  SpO2 95% Constitutional: She is oriented to person, place, and time. She appears well-developed and well-nourished. No distress.  HENT: No nasal discharge.  Head: Normocephalic and atraumatic.  Eyes: Pupils are equal, round, and reactive to light. Right eye exhibits no discharge. Left eye exhibits no discharge.  Neck: Normal range of motion. Neck supple. No JVD present. No thyromegaly present.  Cardiovascular: Normal rate, regular rhythm, normal heart sounds and intact distal pulses. Exam reveals no gallop and no friction rub.  No murmur heard.  Pulmonary/Chest: Effort normal and breath sounds normal. No stridor. No respiratory distress. She has no wheezes. She has no rales. She exhibits no tenderness.  Abdominal: Soft. Bowel sounds are normal. She exhibits no distension. There is  no tenderness. There is no rebound and no guarding.  Musculoskeletal: Normal range of motion. She exhibits no edema and no tenderness.  Neurological: She is alert and oriented to person, place, and time. Coordination normal.  Skin: Skin is warm and dry. No rash noted. She is not diaphoretic. No erythema. No pallor.  Psychiatric: She has a normal mood and affect. Her behavior is normal. Judgment and thought content normal.       ASSESSMENT AND PLAN

## 2010-10-30 NOTE — Assessment & Plan Note (Signed)
The patient had a recent hospitalization for chest pain which was similar to her previous myocardial infarction. She declined coronary angiography. She is currently feeling well and had no recurrent chest pain since her hospital discharge. Due to a previous cardiac history , as well as known borderline significant disease in the left anterior descending artery, I recommended nuclear stress test for further evaluation at this time. However, the patient refused to have this done. She will notify us if she gets the episodes of chest pain. In the meanwhile, we'll continue with medical therapy with dual antiplatelet drugs as well as controlling her risk factors. The patient was worried about possibility of aortic aneurysm which was seen on the plain x-ray recently by an orthopedic surgeon. She actually saw a vascular surgeon in [facility] for this problem. According to the recent CT scan at [facility], there was no evidence of aortic aneurysm. Her descending aorta was significantly tortuous nonetheless. The CT also showed a 50-70% stenosis in the left renal artery.

## 2010-11-01 NOTE — Consult Note (Signed)
NAMEVIRGINIA, CURL NO.:  [id number]  MEDICAL RECORD NO.:  [id number]           PATIENT TYPE:  I  LOCATION:  2609                         FACILITY:  [facility]  PHYSICIAN:  Jonelle Sidle, MD DATE OF BIRTH:  02/05/1956  DATE OF CONSULTATION: DATE OF DISCHARGE:                                CONSULTATION   PRIMARY CARDIOLOGIST:  Lorine Bears, MD  PRIMARY CARE PROVIDER:  Feliciana Rossetti, MD  CHIEF COMPLAINT:  Chest pain.  HISTORY OF PRESENT ILLNESS:  This is a 55 year old Caucasian female with history of diabetes, hypertension, obesity, gastroesophageal reflux disease, obstructive sleep apnea, and reported recent diagnosis of AAA, although details are unclear as well as coronary artery disease status post drug-eluting stent to the RCA in 2004, bare-metal stent to the LAD in 2005 and in-stent restenosis of the RCA with distal stent thrombosis in March 2011 that was treated with a drug-eluting stent with residual 70% stenosis of the LAD that was treated medically.  The patient states she was in her usual state of health until approximately 3:00 p.m. today while she was watching TV and had a sudden, sharp onset of left-sided chest pain that went through to her shoulder blades.  She then had belching without odors.  Her chest pain then spread anteriorly and was more of a dull aching sensation that was associated with nausea and diaphoresis, but no emesis.  The patient took 4 sublingual nitroglycerin without relief as well as aspirin.  She called our office and was brought to the emergency department at [facility] for further evaluation.  The patient has been treated with the pain meds and Zofran in the emergency department with some relief.  Her point-of-care markers are negative x1 and her EKG is with nonspecific ST-T wave changes.  Chest and abdominal CT angiogram is without aortic aneurysm or dissection.  No acute abdominal findings per x-ray or CT.  Of note, the  patient is status post cholecystectomy but has had pancreatitis in the past.  She is currently hemodynamically stable but still appears uncomfortable.  PAST MEDICAL HISTORY: 1. Coronary artery disease status post successful percutaneous     coronary intervention and drug-eluting stent to the distal right     coronary artery secondary to in-stent restenosis.     a.     Status post bare-metal stent to the LAD in 2005.     b.     Status post drug-eluting stent to the RCA in 2004. 2. Hypertension. 3. Diabetes mellitus type 2. 4. Obesity. 5. Hyperlipidemia. 6. Obstructive sleep apnea with CPAP. 7. Hypothyroidism. 8. Gastroesophageal reflux disease. 9. Restless legs syndrome. 10.History of pancreatitis. 11.Ongoing tobacco abuse. 12.Status post cholecystectomy. 13.Status post laminectomy.  SOCIAL HISTORY:  The patient lives in [place] with her husband.  She is a retired Public house manager.  She continues to smoke approximately every other day 4-5 cigarettes.  She denies any alcohol or illicit drug use.  FAMILY HISTORY:  Pertinent for premature coronary artery disease.  Her father passed away of myocardial infarction at age of 84.  Her mother passed away at age of 51 from myocardial infarction.  She has  a brother with hypertension.  ALLERGIES: 1. SULFA. 2. AMOXICILLIN. 3. AMPICILLIN. 4. CODEINE. 5. PICKLES. 6. ACTOS. 7. NORVASC. 8. SAUERKRAUT.  HOME MEDICATIONS: 1. Toprol-XL 100 mg daily. 2. Spironolactone/hydrochlorothiazide 25/25 mg one and half tablet     daily. 3. Lipitor 80 mg daily. 4. Fenofibrate 160 mg daily. 5. Effient 10 mg daily. 6. Aspirin 81 mg daily. 7. Levothyroxine 88 mcg daily. 8. Folic acid 1 mg daily. 9. Clonazepam 0.5 mg one and half tablets daily at bedtime. 10.Omeprazole 20 mg 2 tablets daily. 11.Estrogen/Methyltest 1 tablet daily. 12.Provera 10 mg daily. 13.Cymbalta 60 mg 2 times daily. 14.Tylenol 650 mg at bedtime. 15.Multivitamin daily. 16.Albuterol  sulfate inhale 2 puffs into the lungs every 4 hours as     needed for shortness of breath. 17.Nitroglycerin 0.4 mg sublingual tablets as needed for chest pain. 18.Percocet 7.5/325 mg 1 tablet every 6 hours as needed for pain. 19.Kenalog 0.5% cream topically as needed. 20.Os-Cal 600 mg 2 times daily. 21.Humalog 100 units/mL, 28 units injected 3 times daily before meals. 22.Lantus 100 units/mL 85 units twice daily. 23.Vitamin D 1000 units twice daily. 24.Lasix 40 mg 2 times daily. 25.Fish oil 1000 mg daily.  REVIEW OF SYSTEMS:  All pertinent positives and negatives as stated in HPI.  All other systems have been reviewed and are negative.  PHYSICAL EXAMINATION:  VITAL SIGNS:  Temperature 98.6, pulse 81, respirations 24, and blood pressure 128-137/60-74. GENERAL:  This is an uncomfortable appearing, middle-aged female, she is in no acute distress. HEENT:  Normal. NECK:  Supple without bruit or JVD. HEART:  Regular rate and rhythm with S1 and S2.  No murmur, rub, or gallop noted.  Pulses are 2+ and equal bilaterally. LUNGS:  Clear to auscultation bilaterally without wheezes, rales, or rhonchi. ABDOMEN:  Soft, obese, mild epigastric tenderness.  Positive bowel sounds x4. EXTREMITIES:  No clubbing, cyanosis, or edema. MUSCULOSKELETAL:  No joint deformities or effusions. NEUROLOGIC:  Alert and oriented x3.  Cranial nerves II through XII grossly intact.  Chest x-ray showing no acute cardiopulmonary process, vascularity is at upper limits of normal.  CT angiography of the chest, abdomen, and pelvis:  Negative for aortic dissection or aneurysm.  No acute findings. Atherosclerosis noted.  No acute findings in the abdomen.  Negative for dissection or aneurysm.  Abdominal aorta is markedly tortuous.  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  Fatty infiltration of the liver and hepatomegaly.  Status post cholecystectomy.  Remote appearing L2 superior plate from compression fracture.   EKG showing sinus rhythm at 81 beats per minute.  There are nonspecific ST-T-wave changes.  No acute changes.  CBC pending.  Sodium 139, potassium 3.5, chloride 99, bicarb 30, BUN 8, and creatinine 1.1.  Point-of-care markers negative x1.  ASSESSMENT AND PLAN:  This is a 55 year old female with known coronary artery disease, diabetes mellitus, hyperlipidemia, hypertension, and continued tobacco abuse who presents with symptoms and presentation concerning for unstable angina, although some of her symptoms are new from her prior anginal events.  The patient does report compliance with her aspirin and Effient as well as other cardiac medication.  At this time, the patient will be admitted to the Step-Down Unit and placed on IV nitroglycerin as well as IV heparin, continued on the pain and nausea control.  We will cycle cardiac markers to rule out for myocardial infarction and continue her beta-blocker/statins.  She will be kept n.p.o.  She will likely need catheterization in the morning.  If clinical  scenario worsens, then she will need catheterization prior to this.  We will also check LFTs as well as amylase and lipase, although a GI etiology seems less likely but still a possibility.  We will continue to follow the patient closely.  Further treatment will be dependent upon the above.     Leonette Monarch, PA-C   ______________________________ Jonelle Sidle, MD    NB/MEDQ  D:  10/23/2010  T:  10/24/2010  Job:  [id number]  cc:   Lorine Bears, MD Feliciana Rossetti, MD  Electronically Signed by Alen Blew P.A. on 10/30/2010 03:04:04 PM Electronically Signed by Nona Dell MD on 11/01/2010 07:31:20 AM

## 2010-11-11 NOTE — Assessment & Plan Note (Signed)
[facility]                        [place] CARDIOLOGY OFFICE NOTE   Deanna Dunn, Deanna Dunn                      MRN:          [id number]  DATE:12/04/2009                            DOB:          [birth year]    PROBLEMS LIST:  1. Coronary artery disease.  Last left heart catheterization was in      March 2011, at which time she had in-stent stenosis and stent      thrombosis in the right coronary artery.  She underwent placement      of a promise drug eluding stent 3.5 x 23-mm without complication.      At that time, she also had a 70% narrowing in the proximal left      anterior descending (coronary artery) and normal left ventricular      function.  She had a subsequent nuclear stress test which showed no      evidence of inducible ischemia.  2. Diabetes.  Last hemoglobin A1c of 10.  3. Hyperlipidemia.  Last fasting lipid profile with triglycerides      greater than 600 preventing accurate LDL estimation.  4. Obesity.  5. Gastroesophageal reflux disease.  6. Ongoing tobacco use.  7. PTY 12, assay indicating 0% fluid inhibition on Plavix, hence the      change from Plavix to Effient at the time of the last left heart      catheterization in March 2011.  8. Obstructive sleep apnea.   INTERVAL HISTORY:  The patient states since her most recent heart  catheterization in March, she has had increased shortness of breath.  The shortness of breath occurs usually at rest, but it is not  predictable and often occurs when she is talking.  She does have chronic  dyspnea on exertion.  However, this has not changed recently.  The  patient also endorses an increase in bilateral lower extremity edema,  although she denies any PND or orthopnea.  She has occasional dizziness,  but has not had any syncopal events.  She denies any significant chest  discomfort.  The patient also states that on a recent flight to [place],  she was very short of breath during the  flights and it was resolved once  the flight landed.  She had the same issue on the way back.   SOCIAL HISTORY:  She continues to smoke couple cigarettes daily.  No  alcohol.   FAMILY HISTORY:  Positive for premature coronary disease.   REVIEW OF SYSTEMS:  Positive for symptoms consistent with depression  especially with ruminating thoughts surrounding her medical issues.  She  also has a chronic back pain from prior back surgeries.   PHYSICAL EXAMINATION:  VITAL SIGNS:  Her blood pressure is 120/74, pulse  94, satting 96% on room air, and she weighs 208 pounds.  GENERAL:  No acute distress.  HEENT:  Normocephalic, atraumatic.  There is no JVD.  No carotid bruits.  HEART:  Regular rate and rhythm without murmur, rub, or gallop.  LUNGS:  Clear bilaterally.  ABDOMEN:  Soft and nontender.  EXTREMITIES.  She has 2+ bilateral lower extremity edema.  SKIN:  Cool and dry.  PSYCHIATRIC:  The patient is appropriate with a somewhat flat affect.  NEURO:  Nonfocal.   Review of the patient's medical record as above in problem list.  Review  of the patient's most recent lab work in March of this year,  triglyceride level was 658, HDL 26, LDL could not be calculated because  of the hypertriglyceridemia.  The hemoglobin A1c of 10, white count 9,  hemoglobin 14, hematocrit 40, platelet count 218, TSH 5.9.   ASSESSMENT AND PLAN:  1. Coronary artery disease.  The patient is not having any clear      symptoms of angina.  She had a normal stress test after her last      heart catheterization demonstrating the 70% proximal LAD lesion      probably is not causing significant ischemia.  She will continue on      aspirin and Effient.  She is also on beta-blocker and statin.  2. Shortness of breath.  The patient likely has some element of      diastolic dysfunction that is chronic.  Her ejection fraction at      the time of the heart catheterization was normal.  There was no      right heart  catheterization at that time, so it would be helpful to      evaluate a pulmonary arterial pressures especially seeing that she      had some difficulty with shortness of breath at high altitudes.  We      will proceed with a transthoracic cardiogram for this reason.  We      will also check a BNP.  She has significant volume overload on exam      indicative of possible right heart failure.  Her Lasix likely needs      to be increased from 20 mg b.i.d.  However, she states she had some      issues with her sodium recently, so we will recheck a BMP prior to      increasing her Lasix dose.  3. Hyperlipidemia.  She is on Lipitor 80 mg daily.  Her triglycerides      were very elevated.  In March 2011, we had an extensive talk      regarding limiting carbohydrate intake and she states she believes      she has made improvement since March.  We will recheck a fasting      lipid profile this time.  For now, she should continue on the      Lipitor and the fenofibrate.  This dose could be increased if she      has persistent hypertriglyceridemia.  4. Hypertension.  Her blood pressure is under reasonable control.  5. Depression.  The patient's mood is clearly affected by her multiple      medical conditions.  She is scheduled to see new mental health      Linden Tagliaferro to help address some of these psychological issues.  We      will see the patient back in 4 months' time.     Brayton El, MD  Electronically Signed    SGA/MedQ  DD: 12/04/2009  DT: 12/05/2009  Job #: [id number]

## 2010-11-11 NOTE — Assessment & Plan Note (Signed)
[facility]                        [place] CARDIOLOGY OFFICE NOTE   Deanna Dunn, Deanna Dunn                      MRN:          [id number]  DATE:04/18/2010                            DOB:          01-23-1956    Ms. Deanna Dunn is a 55 year old female who is here today for a followup  visit.  She has the following problem list:  1. Coronary artery disease.  Most recent cardiac catheterization was      done in March 2011, which showed severe in-stent restenosis with      distal stent thrombosis in the right coronary artery.  She      underwent angioplasty and a drug-eluting stent placement with a 3.5      x 23 mm PROMUS stent.  She also was found to have a 70% narrowing      in the mid LAD.  Subsequent nuclear stress testing showed no      evidence of ischemia.  2. Type 2 diabetes with last hemoglobin A1c of 9.  3. Hyperlipidemia with severely elevated triglyceride.  4. Obesity.  5. Gastroesophageal reflux disease.  6. Tobacco use.  7. 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  8. Obstructive sleep apnea.   INTERVAL HISTORY:  The patient overall is doing better than before.  The  last time she saw Dr. Freida Busman, she was complaining of increased dyspnea.  The dose of furosemide was increased and since then she had significant  improvement.  She has not had any recurrent episode of chest pain.  There is no orthopnea, PND, or lower extremity edema.  Her diabetes is  still uncontrolled.  She follows up with the physician at the [facility].   PHYSICAL EXAMINATION:  VITAL SIGNS:  Weight is 229, blood pressure is  134/72, pulse is 102, oxygen saturation is 94% on room air.  NECK:  No JVD.  LUNGS:  Clear to auscultation.  HEART:  Regular rate and rhythm with no gallops or murmurs.  ABDOMEN:  Benign, nontender, nondistended.  EXTREMITIES:  With no clubbing, cyanosis, or edema.   IMPRESSION:  1. Coronary artery disease:  She is having no symptoms of angina  at      this time.  I instructed her to decrease the dose of aspirin to 81      mg once daily.  We will continue with Effient once daily due to her      resistance to clopidogrel.  2. Mild diastolic heart failure:  She seems to be euvolemic at this      time.  We will continue with current dose of furosemide.  Her blood      pressure is controlled.  3. Hyperlipidemia:  Most recent lipid profile showed an improvement in      her triglyceride, but was still 402 with a total cholesterol of      162.  She is currently on Lipitor 80 mg daily.  We will go ahead      and increase fenofibrate from 54 mg to 160 mg once daily.  I also      asked her to increase  the dose of fish oil to 2 capsules a day.  We      will recheck her lipid and liver profile      in 6 weeks.  4. Tobacco use:  I instructed her about the importance of smoking      cessation.  She will follow up with me in 6 months from now or      earlier if needed.     Lorine Bears, MD  Electronically Signed    MA/MedQ  DD: 04/18/2010  DT: 04/19/2010  Job #: [id number]

## 2010-11-11 NOTE — Consult Note (Signed)
NAMELULUBELLE, SIMCOE NO.:  [id number]   MEDICAL RECORD NO.:  [id number]          PATIENT TYPE:  EMS   LOCATION:  MAJO                         FACILITY:  [facility]   PHYSICIAN:  Bettey Mare. Lawrence, NPDATE OF BIRTH:  01/31/1956   DATE OF CONSULTATION:  11/18/2006  DATE OF DISCHARGE:                                 CONSULTATION   PRIMARY CARDIOLOGIST:  Veverly Fells. Excell Seltzer, MD   PRIMARY MD:  Gwen Pounds, MD and Ms. Patram, NP.   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  At work last night sitting at a desk, the patient had,  what she described as substernal sharp chest pain radiating to the jaw  and her throat around 2 a.m. lasting about 1 to 1-1/2 hour.  The patient  came to the emergency room as a result.  She took nitroglycerin x3 at  work and helped the jaw pain, but chest sharpness continued.  In the ER,  the patient was given nitroglycerin and morphine, and this helped  relieve her, and the patient fell asleep.  The patient is now pain free.  The patient states that she had a similar pain, as the pain she had  prior to stent placement with mild diaphoresis and mild shortness of  breath.  The patient does have a significant history of coronary artery  disease for an acute inferior MI in 2004 and stent placement in 2004 to  the right coronary artery.  The patient is refusing admission, stating  she does not wish to stay in the hospital to rule out MI.  The patient  is pain free at present.   PAST MEDICAL HISTORY:  Coronary artery disease, acute inferior MI in  March of 2004 with stent to the right coronary artery in 2004 x3.  The  patient has a history of hypertension and hyperlipidemia,  hypothyroidism, GERD, insulin-dependent diabetes.  She is peri-  menopausal.   PAST CARDIAC WORKUP:  The patient had a cardiac catheterization in April  of  2005 revealing LAD with 30% proximal to mid section to the apex,  first diagonal 30% proximal segment while diagonal severe narrowing at  90% with mild distal filling, of collateralsfrom the right coronary  artery.  Left circumflex:  Mild diffuse 30% right coronary artery  dominant 3 stents patent.  Diffuse 30% proximal to mid RCA, distal20 to  30%, moderate 30 to 40% narrowing posterior ventricular branch prior to  stent.  The patient had subsequentfollowup Cardiolite stress test in  2005, which was negative.   PAST SURGICAL HISTORY:  1. Bilateral tubal ligation.  2. Cholecystectomy.  3. Back surgery.   SOCIAL:  The patient lives in [place] with her husband.  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  She is a 60-pack-year smoker with ongoing 2-  pack-a-day smoking, negative for EtOH, negative for drug use.  No  regular exercise program.  She is to be on a diabetic diet, but  she is  not compliant.  No overall medicine use.   FAMILY HISTORY:  Mother died in her 20s with an MI, and her father had a  history of diabetes.  Father died at age 57 of an MI.  She had a brother  with a CVA and a blood clotting disorder.   REVIEW OF SYSTEMS:  Positive for chest pain, shortness of breath, jaw  pain, and nausea.  All other systems reviewed and found to be negative.   CURRENT MEDICATIONS:  1. Toprol 100 mg once a day.  2. Folic acid 1 mg once a day.  3. Glyburide 5 mg in the a.m. and 2.5 mg in the p.m.  4. Tricor 48 mg once a day.  5. Plavix 75 mg daily.  6. Synthroid 150 mcg once a day.  7. Aspirin 325 mg once a day.  8. Prilosec 20 mg b.i.d.  9. Clonazepam p.r.n.  10.Estratest once a day.  11.Calcium once a day.  12.Multivitamin once a day.  13.Lasix 20 mg 1 to 1-1/2 tablet once a day.  14.Spironolactone/HCTZ 25/25 one p.o. daily.   ALLERGIES:  1. SULFA causing an anaphylaxis.  2. CODEINE causing hyperactive state.  3. Amoxicillin causing rash.  4. Pickles causing anaphylaxis.    CURRENT LABORATORY:  Hemoglobin 15.6, hematocrit 46.0.  sodium 135,  potassium 3.3, chloride 100, CO2 of 48, BUN 20, creatinine 0.8, glucose  215.  Point-of-care troponins less than 0.05, CK-MB 1.0, myoglobin 41.2.  Chest x-ray revealing mild cardiac enlargement, minimal segmental  atelectasis at the left lung base.  LVEF per cardiac catheterization is  55%.   PHYSICAL EXAMINATION:  VITAL SIGNS:  Blood pressure 101/64, heart rate  69, respirations 24 with temperature 97.9, O2 sat 95% on room air.  HEENT:  Head is normocephalic and atraumatic.  Eyes:  PERRLA.  The  patient wears glasses.  Mucous membranes of mouth are pink and moist.  Tongue is midline.  NECK:  Neck is supple without JVD or carotid bruits.  Her neck is obese.  CARDIOVASCULAR:  Distant heart sounds.  S1, S2 is auscultated without  bruits, murmurs, rubs, or gallops.  LUNGS:  Clear to auscultation with nonexpiratory wheeze.  The patient  does smell heavily of cigarettes.  ABDOMEN:  Obese, nontender, with 2+ bowel sounds.  EXTREMITIES:  Without clubbing, cyanosis, rash, or edema.  NEURO:  Cranial nerves II-XII are grossly intact.  She is awake, alert,  and oriented.   IMPRESSION:  1. Chest pain in patient with known coronary artery disease with      symptoms of unstable angina.  2. Ongoing tobacco abuse.  3. Hypercholesterolemia.  4. Hypothyroidism.  5. Diabetes.  6. Rule out pulmonary embolus.  7. The patient on hormone replacement therapy with continued tobacco      abuse.   PLAN:  The patient has been seen and examined by myself and Dr. Olga Millers in the emergency room.  The patient is a 55 year old Caucasian  female with past medical history of coronary artery disease, diabetes,  hypercholesterolemia, gastroesophageal reflux disease, and chest pain  who developed chest pain while at work this morning.  The patient presents with mild pain continuing, duration between 5 to 6 hours.  EKG  revealing normal  sinus rhythm with PRWP and inferior myocardial  infarction.  Initial enzymes are negative.   We have recommended admission to rule out myocardial infarction and  would then favor a cath; however, the patient declines admission and  further  cardiac workup and wants to sign out against medical advice.  The risk of myocardial infarction, death explained, and the patient  still refuses admission.  The patient will be followed up by Dr. Tonny Bollman in his office on June 5 at 11:15 a.m. with subsequent myocardial  perfusion Myoview study to be completed.  The patient is to continue her  home medications.  The patient has been advised repeatedly to be  admitted secondary to her symptoms; however, she has continued to  refuse.  The patient will continue aspirin and Plavix, Troprol, and  other medications as she was on at home.  We have strongly suggested  discontinuing tobacco use.  We have advised the patient that should she  return, we would be happy to care for her.  In the interim, the patient  will see Dr. Excell Seltzer on June 5 at 11:15 a.m. and schedule a Cardiolite  Myoview study.      Bettey Mare. Lyman Bishop, NP     KML/MEDQ  D:  11/18/2006  T:  11/18/2006  Job:  [id number]   cc:   Letta Moynahan, NP  Gwen Pounds, MD

## 2010-11-11 NOTE — Assessment & Plan Note (Signed)
[facility]                            CARDIOLOGY OFFICE NOTE   TORUNN, CHANCELLOR                      MRN:          [id number]  DATE:12/02/2006                            DOB:          1955-08-24    Deanna Dunn was seen in followup at the [facility] Cardiology office on  December 02, 2006.  She is a 55 year old woman who has coronary artery  disease.  She has been followed in our office in the past and has had  stenting of the right coronary artery several years ago.  She presented  to the emergency department back on May 22nd with severe substernal  chest pain.  The episode lasted approximately 60-90 minutes.  She ruled  out for myocardial infarction.  Hospital admission for complete rule out  MI and probable stress test was planned, but she left the hospital  against medical advice.   Since she has been home, she had done quite well.  She has had no  further episodes.  She described a sensation of substernal chest pain  radiating to the neck and left shoulder.  The episode started at rest  and ended fairly abruptly.  She has been very active since she has been  home and has had no further episodes.  It had been greater than 1 year  since any similar episodes of chest pain.  She has had recurrent chest  pain in the past, and has undergone catheterization, most recently in  2005 that showed patent stents in the right coronary artery and no  significant disease in her major epicardial vessels.  Her ejection  fraction has been preserved at 55%.   She underwent a exercise Myoview study here in the office on May 28th  that demonstrated no evidence of scar or ischemia.  She had no  significant ST changes.  She had a normal blood pressure response to  exercise.  Of note, her study was nondiagnostic because she only  achieved 78% of her maximum predicted heart rate.  She was limited by  hip pain which is a chronic problem for her.  She was able to  exercise  for 5 minutes and 36 seconds on the Bruce protocol.   MEDICATIONS:  1. Aspirin 325 mg daily.  2. Estratest daily.  3. Calcium 600 mg twice daily.  4. Multivitamin daily.  5. Spironolactone/hydrochlorothiazide 25/25 one and a half daily.  6. Cymbalta 60 mg daily.  7. Tylenol Arthritis daily.  8. Toprol XL 100 mg daily.  9. Lipitor 80 mg daily.  10.Singulair 10 mg.  11.Lasix 30 mg daily.  12.Tricor 48 mg daily.  13.Glyburide.  14.Neurontin.  15.Prilosec 20 mg 2 twice daily.  16.Lantus.  17.Folic acid.  18.Plavix 75 mg daily.  19.Synthroid 188 mcg daily.   ALLERGIES:  SULFA, AMOXICILLIN, AND CODEINE.   PHYSICAL EXAMINATION:  The patient is alert and oriented.  No acute  distress.  Obese white female.  Blood pressure is 126/76, heart rate is 96, respiratory rate 16.  HEENT:  Normal.  NECK:  Normal carotid upstrokes without bruits.  Jugular venous pressure  is normal.  LUNGS:  Clear to auscultation bilaterally.  HEART:  Regular rate and rhythm without murmurs or gallops.  ABDOMEN:  Soft, obese, nontender, no organomegaly.  EXTREMITIES:  No cyanosis, clubbing, or edema.  Peripheral pulses are 2+  and equal throughout.   ASSESSMENT:  Artavia Dunn is currently stable from a cardiovascular  standpoint.  Her cardiac problems are as follows:  1. Chest pain.  She had an isolated episode that was likely      noncardiac.  Her stress test was reassuring.  While it was not      diagnostic, it occurred at the highest level of workload that she      will likely achieve due to her hip problem.  I am comfortable not      persuing any further studies at this point as she has been active      and is having no symptoms.  She should continue on her current      medical therapy as she is on an excellent regimen.  2. Hyperlipidemia.  She is on high-dose Lipitor as well as Tricor.      Her goal LDL cholesterol is less than 70.  Her lipids are followed      by her primary care  physician.  3. Hypertension.  She has optimal control on Toprol XL.  I did not      make any changes to her medical regimen today, but she would      benefit from an angiotensin-converting enzyme inhibitor in the      setting of her diabetes and coronary artery disease.  I will likely      add this at her next office visit.  4. Followup.  I would like to see Ms. Shin back in 6 months or      sooner if any new problems arise.  I counseled her today on smoking      cessation as well.     Veverly Fells. Excell Seltzer, MD  Electronically Signed    MDC/MedQ  DD: 12/02/2006  DT: 12/02/2006  Job #: [id number]   cc:   Otelia Limes, NP

## 2010-11-14 NOTE — Cardiovascular Report (Signed)
NAMELILJA, SOLAND                         ACCOUNT NO.:  [id number]   MEDICAL RECORD NO.:  [id number]                   PATIENT TYPE:  INP   LOCATION:  4711                                 FACILITY:  [facility]   PHYSICIAN:  Veneda Melter, M.D.                   DATE OF BIRTH:  01-Apr-1956   DATE OF PROCEDURE:  10/04/2003  DATE OF DISCHARGE:  10/04/2003                              CARDIAC CATHETERIZATION   PROCEDURES PERFORMED:  1. Left heart catheterization.  2. Left ventriculogram.  3. Selective coronary angiography.   DIAGNOSES:  1. Coronary atherosclerotic disease.  2. Normal left systolic function.  3. Patent stents in the coronary artery.   CARDIOLOGIST:  Veneda Melter, M.D.   HISTORY:  Ms. Scherger is a 55 year old white female with known history of  coronary disease who has previously undergone percutaneous intervention with  stent placement to the main and distal right coronary artery.  She presented  with severe substernal chest discomfort.  The patient had o EKG changes.  She was admitted to the hospital and ruled out for acute myocardial  infarction.  She presents now for further assessment.   TECHNIQUE:  Informed consent was obtained.  The patient was brought to the  catheterization lab.  A 6 French sheath was placed in the right femoral  artery using the modified Seldinger technique.  Six [place] JL-4 and JR-4  catheters were then used to engage the left and right coronary arteries.  Selective angiography was performed in various projections using manual  injection of contrast.  A 6 French pigtail catheter was then advanced into  the left ventricle and a left ventriculogram performed using power injection  of contrast.   At the termination of the case catheters and sheaths were removed.  Manual  pressure was applied until adequate hemostasis was achieved.   The patient tolerated the procedure well and was transferred to the floor in  stable condition.   FINDINGS:   The findings are as follows.  1. Left Main Trunk:  The left main trunk is a medium caliber vessel with     mild irregularities.   1. LAD:  The LAD is a medium caliber vessel that provides a large first     diagonal branch in the proximal segment, two small diagonal branches in     the mid section and then extends to the apex.  The LAD has mild disease     of 30% in the proximal and mid section.  The first diagonal branch has     mild disease of 30% in its proximal segment.  A small second diagonal     branch has severe narrowing of 90% in the mid section with mid distal     vessel filling the collaterals from the right coronary artery.   1. Left Circumflex Artery:  The left circumflex artery is a small caliber  vessel that provides two marginal branches.  The left circumflex system     has mild diffuse disease of 30%.   1. Right Coronary Artery:  The right coronary artery is dominant.  This is a     large caliber vessel that provides posterior descending artery and the     posterior ventricular branch in the terminal segment.  The right coronary     artery has three previously placed stents in the mid section that are     widely patent and there is diffuse disease of 30% in the proximal mid     right coronary artery.  The distal RCA has mild disease of 20-30%.  There     is a previously placed in the proximal segment of the posterior     ventricular branch, which has moderate narrowing of 30-40% prior to the     stent.  The posterior descending artery and posterior ventricular branch     have mild irregularities.   1. LV:  Normal end-systolic and end-diastolic dimensions.  Overall left     ventricular function is well-preserved with an ejection fraction of     greater than 55%.  No mitral regurgitation.  LV pressure is 111/5 and     aortic is 110/64.  LVEDP equals 20.   ASSESSMENT AND PLAN:  Ms. Guest is a 55 year old female with stable  coronary artery disease.  She has widely  patent stents in the right coronary  artery.   Continued medical therapy will be pursued.                                               Veneda Melter, M.D.    NG/MEDQ  D:  10/04/2003  T:  10/05/2003  Job:  [id number]   cc:   Feliciana Rossetti, MD  [address]  [place]  [place] [address]  Fax: [phone]

## 2010-11-14 NOTE — Discharge Summary (Signed)
Deanna Dunn, Deanna Dunn                         ACCOUNT NO.:  [id number]   MEDICAL RECORD NO.:  [id number]                   PATIENT TYPE:  INP   LOCATION:  4711                                 FACILITY:  [facility]   PHYSICIAN:  Deanna Dunn, P.A. [facility]                DATE OF BIRTH:  18-Jan-1956   DATE OF ADMISSION:  10/02/2003  DATE OF DISCHARGE:  10/04/2003                           DISCHARGE SUMMARY - REFERRING   DISCHARGE DIAGNOSES:  1. Coronary artery disease, stable.  2. Hypertension, treated.  3. Hyperlipidemia, treated.  4. Hyperthyroidism, treated.  5. Seasonal allergies.  6. Status post bilateral tubal ligation.  7. Status post cholecystectomy.  8. Status post back surgery.  9. Allergies to SULFA, CODEINE, and AMOXICILLIN.   HOSPITAL COURSE:  Deanna Dunn is a 55 year old female patient who presented  to [facility] in March 2004 with an acute MI requiring stent  placement to the RCA and posterior branch of the RCA. Approximately one  month ago she had recurrent total occlusion of the right coronary artery and  the vessel was stented with good flow. She now returns with intermittent  episode of dyspnea and sharp chest pain with some response to nitroglycerin.  A stress Cardiolite study approximately two weeks was negative, but she has  had severe recurrent symptoms since that time and has presented to [facility] for evaluation.   Lab studies during her hospital stay reveal a hemoglobin A1C of 6.7. Cardiac  isoenzymes negative. Sodium 139, potassium 3.8, BUN 12, creatinine 0.8.  White count 12.8, hemoglobin 12.7, hematocrit 36.6, platelet count 327,000.   The patient ultimately underwent cardiac catheterization on October 04, 2003,  and was found to have mild left main disease. The LAD had three diagonals.  The first diagonal had a 30% proximal lesion and the second one had a 90%  lesion, circumflex to the OM with mild disease. The right coronary artery  with 30% mid  in-stent restenosis. Otherwise, no significant angiographical  stenosis. At this point Dr. Chales Abrahams felt that patient was stable from a  coronary artery disease standpoint and felt medical therapy would be  warranted. He did recommend an outpatient EGD.   At this point the patient will be discharged on medications prior to  admission which include Nexium 40 mg a day, Lasix 20 mg a day, Dyazide one a  day, aspirin 325 mg a day, Toprol XL 25 mg a day, levothyroxine 0.15 mg  daily, Allegra 180 mg a day, hormone replacement, Lipitor 40 mg a day, and  sublingual nitroglycerin p.r.n. chest pain.  She may utilize Tylenol if needed for pain. No heavy lifting or driving for  two days, and gradual increase activity. Remain no a low fat diet. Clean  over cath site with soap and water. Call if any questions or concerns.  Follow up with cardiology as discussed and follow up with primary care  physician, Deanna Dunn  as well as Deanna Dunn for EGD.                                                Deanna Dunn, P.A. [facility]    LB/MEDQ  D:  10/04/2003  T:  10/05/2003  Job:  [id number]   cc:   Deanna Rossetti, MD  [address]  [place]  [place] [address]  Fax: [phone]   Deanna Dunn, [place]   Deanna Dunn, M.D.

## 2010-11-14 NOTE — H&P (Signed)
NAMEMARIS, Deanna Dunn                         ACCOUNT NO.:  [id number]   MEDICAL RECORD NO.:  [id number]                   PATIENT TYPE:  INP   LOCATION:  4711                                 FACILITY:  [facility]   PHYSICIAN:  [place]a [place] Bing, M.D.               DATE OF BIRTH:  25-Dec-1955   DATE OF ADMISSION:  10/02/2003  DATE OF DISCHARGE:                                HISTORY & PHYSICAL   REFERRING PHYSICIAN:  Dr. Wynonia Sours, [place].   PRIMARY CARE PHYSICIAN:  Feliciana Rossetti, MD.   PRIMARY Corinda Gubler CARDIOLOGIST:  Veneda Melter, M.D.   HISTORY OF PRESENT ILLNESS:  A 55 year old woman with recent myocardial  infarction returns with recurrent chest discomfort.  [name] first  presented in March of 2004 with acute MI requiring stent placement to the  RCA and a posterior ventricular branch of the RCA.  She returned  approximately a month ago with recurrent chest discomfort.  EKG suggested  acute inferior injury prompting urgent catheterization which revealed total  occlusion of the right coronary.  The vessel was stented, resulting in no  residual stenosis and good flow.  She had noncritical disease including a  30% LAD, a 30% ostial diagonal and a 50% circumflex lesion.  left  ventricular function was mildly impaired due to severe inferior hypokinesis.   Since returning home, the patient has had intermittent episodes of dyspnea  and sharp chest pain, typically with onset at rest.  There is some response  to nitroglycerin.  She has been evaluated by Dr. Dulce Sellar for these symptoms.  A stress Cardiolite study two weeks ago was reportedly negative.  She  experienced fairly severe recurrent symptoms this morning.  Three  nitroglycerin resulted in subtotal resolution, prompting her to come to the  [facility] Emergency Department.   Cardiovascular risk factors include hypertension and hyperlipidemia.   PAST MEDICAL HISTORY:  1. Status post bilateral tubal ligation.  2.  Cholecystectomy.  3. Back surgery.  4. History of hypothyroidism.  5. Seasonal allergies.   ALLERGIES:  Adverse reactions or allergies to SULFA DRUGS, CODEINE, and  AMOXICILLIN are reported.   MEDICATIONS ON ADMISSION:  1. Nexium 40 mg daily.  2. Furosemide 20 mg daily.  3. Diazide one daily.  4. Aspirin 325 mg daily.  5. Toprol 25 mg daily.  6. Levothyroxine 0.15 mg daily.  7. Allegra 180 mg daily.  8. Hormone replacement.  9. Atorvastatin 40 mg daily.  10.      Sublingual nitroglycerin.   REVIEW OF SYMPTOMS:  Post menopausal, intermittent flushing; arthralgias,  most notably low back pain.  Depression and anxiety.  All other systems  reviewed and are negative.   PHYSICAL EXAMINATION:  GENERAL APPEARANCE:  An overweight, anxious woman.  VITAL SIGNS:  Temperature 98, heart rate 92 and regular, respiratory rate  18, blood pressure 115/65, O2 saturation 100% on room air.  HEENT:  Anicteric sclerae.  NECK:  No jugular venous distension.  No carotid bruits.  ENDOCRINE:  No thyromegaly.  SKIN:  No significant lesions.  HEMATOPOIETIC:  No adenopathy.  LUNGS:  Clear.  CARDIOVASCULAR:  Normal first and second heart sounds.  ABDOMEN:  Soft and nontender.  No organomegaly.  EXTREMITIES:  Normal distal pulses.  No edema.  MUSCULOSKELETAL:  No joint deformities.  NEUROMUSCULAR:  Symmetric strength and tone.   EKG:  Normal sinus rhythm, prior inferior myocardial infarction.   LABORATORY DATA:  Normal CBC, normal chemistry profile and normal point of  care markers.   IMPRESSION:  A young woman with myocardial infarctions on two occasions in  the past related to right coronary artery disease.  She has continued to  have chest discomfort and dyspnea, which may or may not be related to her  coronary disease.  A recent negative Cardiolite study should denote low  risk.  She will be admitted for serial cardiac markers and adjustment of  medical therapy.  Amlodipine will be added at a  dose of 5 mg daily.  Her  dose of Toprol will be increased to 50 mg daily.  If she does well on this  regimen, a brief hospitalization is anticipated.  If she continues to have  symptoms, repeat coronary angiography may prove necessary.                                                [place] Bing, M.D.    RR/MEDQ  D:  10/02/2003  T:  10/03/2003  Job:  [id number]   cc:   Dr. Wynonia Sours, [place]   Feliciana Rossetti, MD  [address]  [place]  [place] [address]  Fax: [phone]   Veneda Melter, M.D.

## 2010-11-15 ENCOUNTER — Other Ambulatory Visit: Payer: Self-pay | Admitting: *Deleted

## 2010-11-15 MED ORDER — FUROSEMIDE 40 MG PO TABS: 40.0000 mg | ORAL_TABLET | Freq: Two times a day (BID) | ORAL | Status: DC

## 2011-01-22 ENCOUNTER — Other Ambulatory Visit: Payer: Self-pay | Admitting: *Deleted

## 2011-01-22 MED ORDER — PRASUGREL HCL 10 MG PO TABS: 10.0000 mg | ORAL_TABLET | Freq: Every day | ORAL | Status: DC

## 2011-05-30 ENCOUNTER — Other Ambulatory Visit: Payer: Self-pay | Admitting: Cardiology

## 2011-06-18 ENCOUNTER — Other Ambulatory Visit: Payer: Self-pay | Admitting: Cardiovascular Disease

## 2011-08-30 IMAGING — CT CT ANGIO CHEST
2 of 7 series · 18 of 46 positions shown · IV contrast (APPLIED)
Comparison: CT chest 10/03/2003.

CTA CHEST

CLINICAL DATA: Chest pain radiating to the back and abdomen.
Nausea.

CT ANGIOGRAPHY CHEST, ABDOMEN AND PELVIS
TECHNIQUE: Multidetector CT imaging through the chest, abdomen and
pelvis was performed using the standard protocol during bolus
administration of intravenous contrast.  Multiplanar reconstructed
images including MIPs were obtained and reviewed to evaluate the
vascular anatomy.
Contrast: 100 ml Omnipaque 350.

[Series 4: dissection 2.0 st · axial · 0.96mm/px · z∈[-654,-88]mm · 15 of 317 slices shown]
[im 17/317  lung]
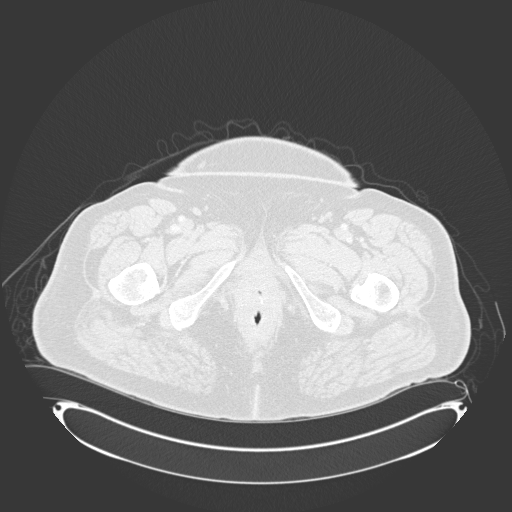
[im 34/317  soft-tissue]
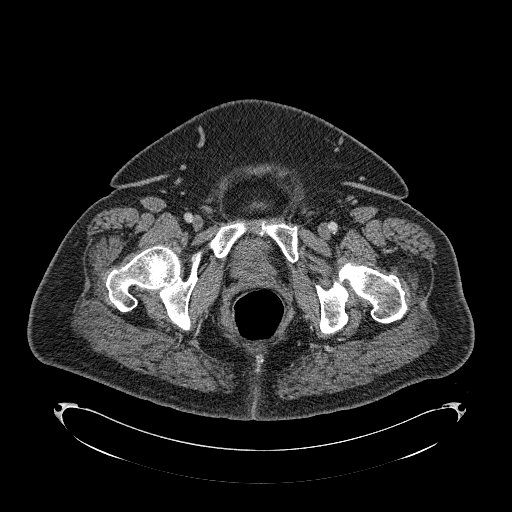
[im 67/317  lung]
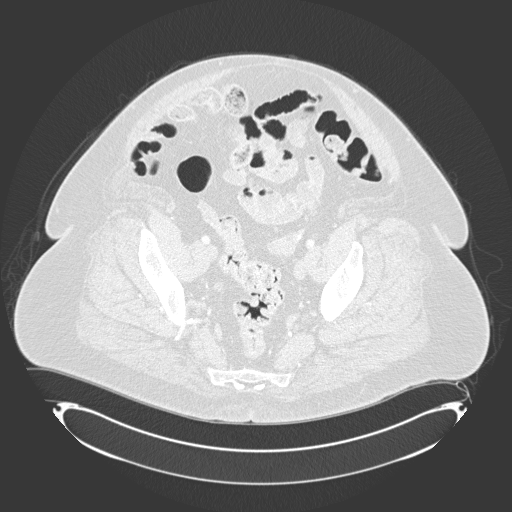
[im 84/317  soft-tissue]
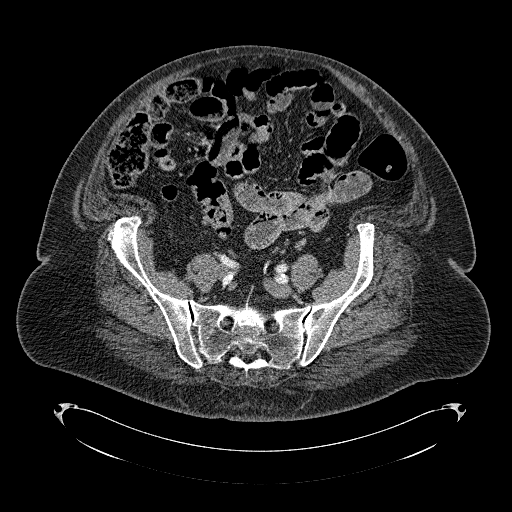
[im 100/317  lung]
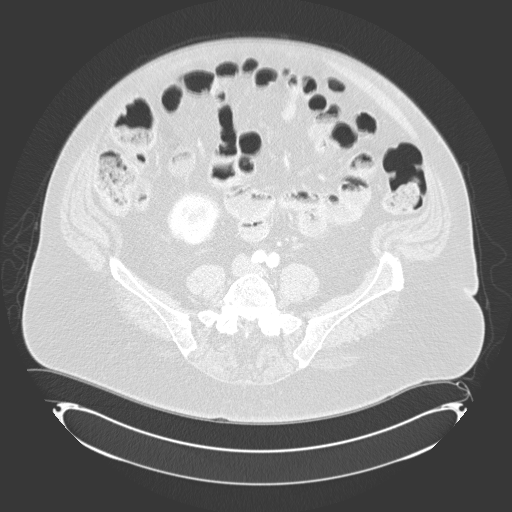
[im 117/317  soft-tissue]
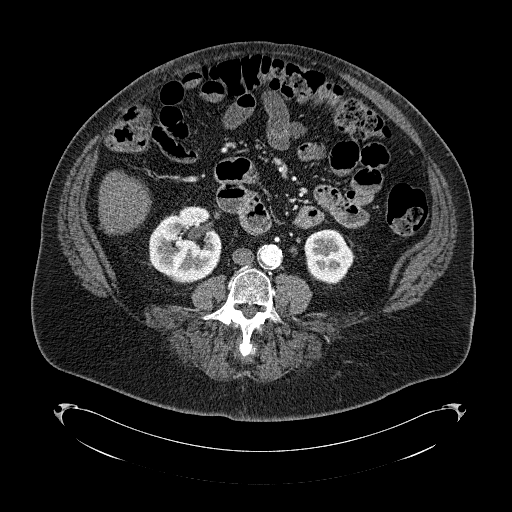
[im 134/317  lung]
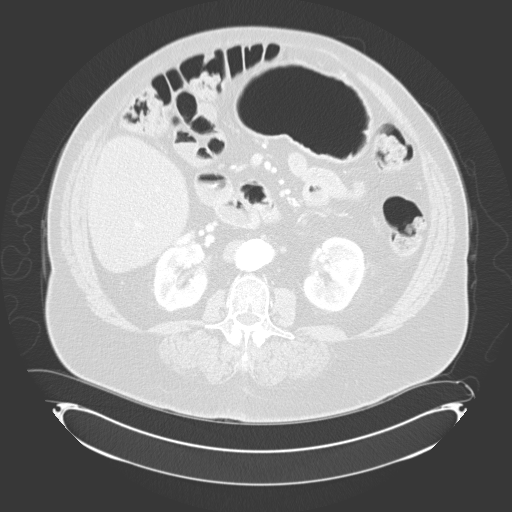
[im 167/317  soft-tissue]
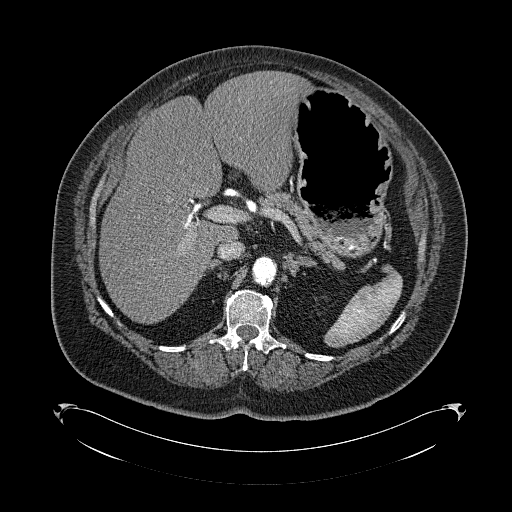
[im 183/317  lung]
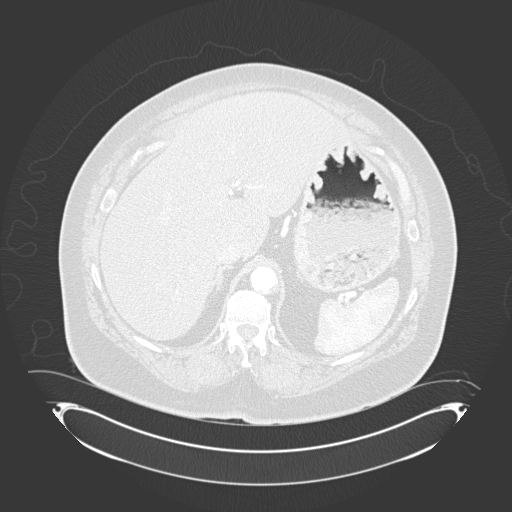
[im 200/317  soft-tissue]
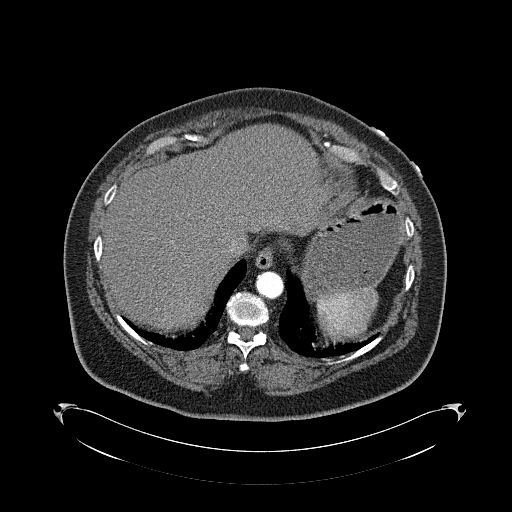
[im 217/317  lung]
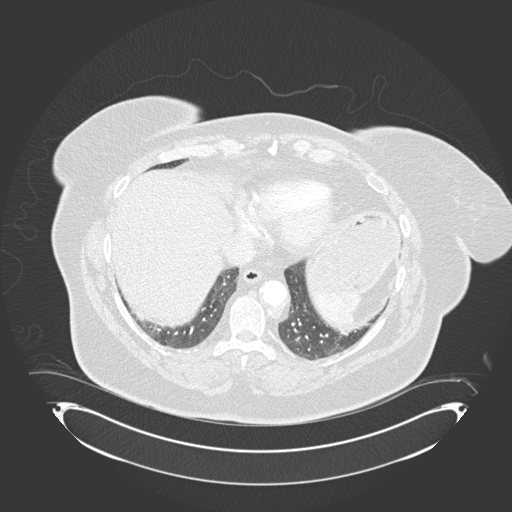
[im 233/317  soft-tissue]
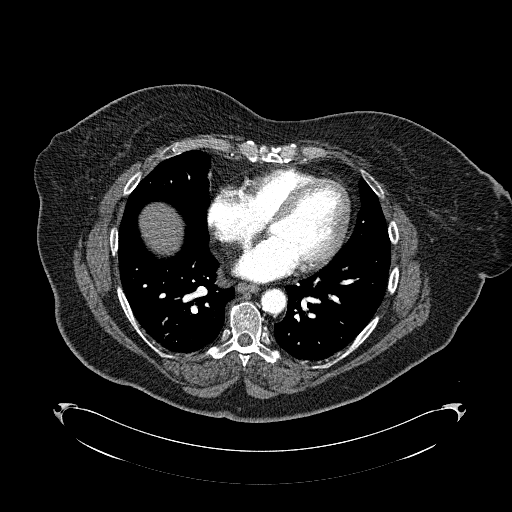
[im 267/317  lung]
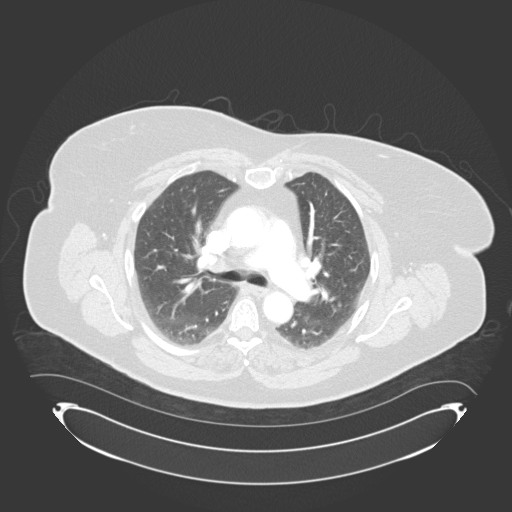
[im 283/317  soft-tissue]
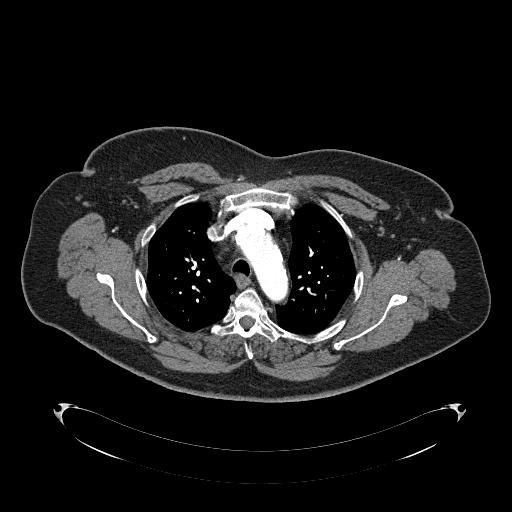
[im 300/317  lung]
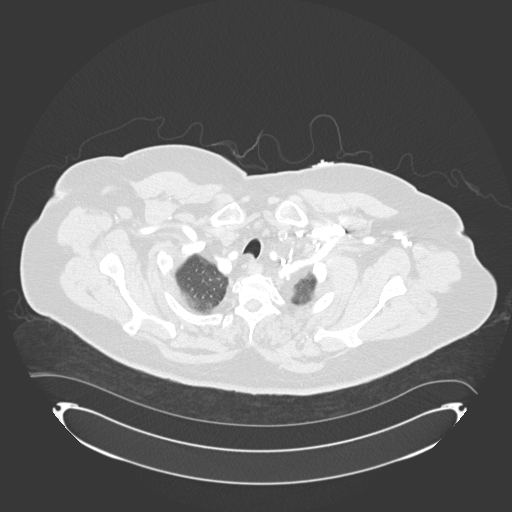

[Series 602: cor · coronal · 1.24mm/px · 3 of 106 slices shown]
[im 27/106  soft-tissue]
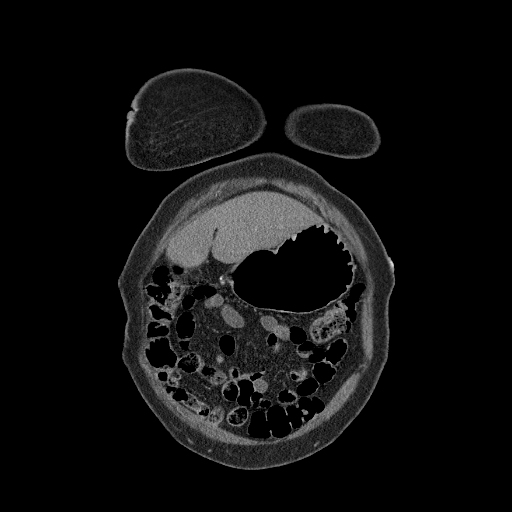
[im 53/106  soft-tissue]
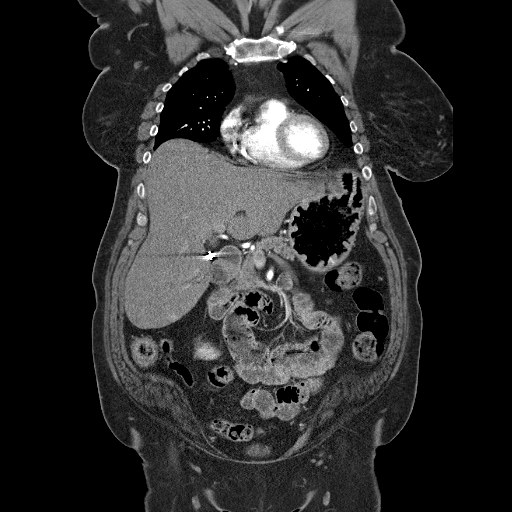
[im 79/106  soft-tissue]
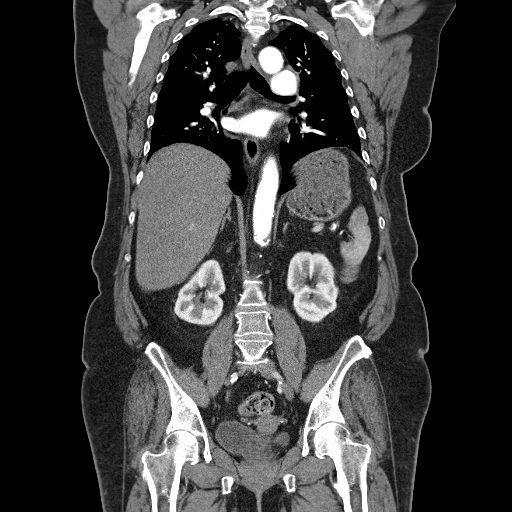

[18 of 46 positions shown; findings below may reference images not displayed]

FINDINGS: No dissection or aneurysm of the thoracic aorta is
present.  The patient has fairly extensive atherosclerotic vascular
disease.  No axillary, hilar mediastinal lymphadenopathy.  Heart
size upper normal.  No pleural or pericardial effusion.  Lungs
demonstrate some dependent atelectasis and scattered ground-glass
attenuation.  No consolidative process, nodule or mass.  No focal
bony abnormality.

 Review of the MIP images confirms the above findings.
IMPRESSION: 1.  Negative for aortic dissection or aneurysm.  No acute finding.
2.  Atherosclerosis.

CTA ABDOMEN AND PELVIS
FINDINGS: The descending abdominal aorta is tortuous but no
aneurysm is identified.  There is no dissection.  There is some
plaque at the takeoff of the left renal artery with stenosis
estimated at 50-70%.  Major branch vessels otherwise appear patent.

The patient is status post cholecystectomy.  The liver is low
attenuating consistent with fatty change.  No focal liver lesion or
biliary ductal dilatation.  The liver is enlarged measuring
cm.  The spleen, adrenal glands, pancreas and kidneys appear
normal.  Uterus, adnexa and urinary bladder are unremarkable.  The
stomach and small large bowel are unremarkable.  No lymphadenopathy
or fluid.  Bones demonstrate remote appearing compression fracture
of the superior endplate of L2.  No lytic or sclerotic lesion.

 Review of the MIP images confirms the above findings.
IMPRESSION: 1.  No acute finding.  Negative for dissection or aneurysm.  The
abdominal aorta is markedly tortuous.
2.  Stenosis of the left renal artery estimated at 50-70%.
3.  Fatty infiltration of the liver and hepatomegaly.
4.  Status post cholecystectomy.
5.  Remote appearing L2 superior plate compression fracture.

## 2012-01-12 ENCOUNTER — Other Ambulatory Visit: Payer: Self-pay | Admitting: *Deleted

## 2012-01-12 MED ORDER — FUROSEMIDE 40 MG PO TABS: 40.0000 mg | ORAL_TABLET | Freq: Every day | ORAL | Status: DC

## 2012-01-13 ENCOUNTER — Other Ambulatory Visit: Payer: Self-pay | Admitting: Cardiovascular Disease

## 2012-01-13 MED ORDER — ISOSORBIDE MONONITRATE ER 30 MG PO TB24: 30.0000 mg | ORAL_TABLET | Freq: Every day | ORAL | Status: AC

## 2012-02-09 ENCOUNTER — Other Ambulatory Visit: Payer: Self-pay | Admitting: *Deleted

## 2012-02-09 MED ORDER — PRASUGREL HCL 10 MG PO TABS: 10.0000 mg | ORAL_TABLET | Freq: Every day | ORAL | Status: DC

## 2012-04-13 ENCOUNTER — Telehealth: Payer: Self-pay | Admitting: Cardiovascular Disease

## 2012-04-13 NOTE — Telephone Encounter (Signed)
error 

## 2012-04-14 ENCOUNTER — Telehealth: Payer: Self-pay | Admitting: *Deleted

## 2012-04-14 DIAGNOSIS — E079 Disorder of thyroid, unspecified: Secondary | ICD-10-CM

## 2012-04-14 HISTORY — DX: Disorder of thyroid, unspecified: E07.9

## 2012-04-14 NOTE — Telephone Encounter (Signed)
LMTCB pt requesting refill for Isosorbide and has not been seen by Dr. Kirke Corin over a year. Needs to schedule appointment to be seen.

## 2012-05-24 ENCOUNTER — Other Ambulatory Visit: Payer: Self-pay

## 2012-05-24 MED ORDER — FUROSEMIDE 40 MG PO TABS: 40.0000 mg | ORAL_TABLET | Freq: Every day | ORAL | Status: DC

## 2015-07-09 HISTORY — PX: HEMIARTHROPLASTY HIP: SUR652

## 2015-08-01 DIAGNOSIS — F330 Major depressive disorder, recurrent, mild: Secondary | ICD-10-CM

## 2015-08-01 HISTORY — DX: Major depressive disorder, recurrent, mild: F33.0

## 2015-08-02 DIAGNOSIS — I714 Abdominal aortic aneurysm, without rupture, unspecified: Secondary | ICD-10-CM | POA: Insufficient documentation

## 2015-08-02 DIAGNOSIS — M5137 Other intervertebral disc degeneration, lumbosacral region: Secondary | ICD-10-CM

## 2015-08-02 DIAGNOSIS — J449 Chronic obstructive pulmonary disease, unspecified: Secondary | ICD-10-CM

## 2015-08-02 DIAGNOSIS — J45909 Unspecified asthma, uncomplicated: Secondary | ICD-10-CM

## 2015-08-02 DIAGNOSIS — E559 Vitamin D deficiency, unspecified: Secondary | ICD-10-CM

## 2015-08-02 DIAGNOSIS — Z794 Long term (current) use of insulin: Secondary | ICD-10-CM

## 2015-08-02 DIAGNOSIS — E1143 Type 2 diabetes mellitus with diabetic autonomic (poly)neuropathy: Secondary | ICD-10-CM

## 2015-08-02 DIAGNOSIS — F341 Dysthymic disorder: Secondary | ICD-10-CM

## 2015-08-02 DIAGNOSIS — Z9989 Dependence on other enabling machines and devices: Secondary | ICD-10-CM

## 2015-08-02 DIAGNOSIS — M51379 Other intervertebral disc degeneration, lumbosacral region without mention of lumbar back pain or lower extremity pain: Secondary | ICD-10-CM

## 2015-08-02 DIAGNOSIS — G4733 Obstructive sleep apnea (adult) (pediatric): Secondary | ICD-10-CM | POA: Insufficient documentation

## 2015-08-02 DIAGNOSIS — J4489 Other specified chronic obstructive pulmonary disease: Secondary | ICD-10-CM | POA: Insufficient documentation

## 2015-08-02 DIAGNOSIS — M159 Polyosteoarthritis, unspecified: Secondary | ICD-10-CM

## 2015-08-02 DIAGNOSIS — E1140 Type 2 diabetes mellitus with diabetic neuropathy, unspecified: Secondary | ICD-10-CM

## 2015-08-02 HISTORY — DX: Polyosteoarthritis, unspecified: M15.9

## 2015-08-02 HISTORY — DX: Other specified chronic obstructive pulmonary disease: J44.89

## 2015-08-02 HISTORY — DX: Obstructive sleep apnea (adult) (pediatric): G47.33

## 2015-08-02 HISTORY — DX: Vitamin D deficiency, unspecified: E55.9

## 2015-08-02 HISTORY — DX: Other intervertebral disc degeneration, lumbosacral region without mention of lumbar back pain or lower extremity pain: M51.379

## 2015-08-02 HISTORY — DX: Type 2 diabetes mellitus with diabetic autonomic (poly)neuropathy: E11.43

## 2015-08-02 HISTORY — DX: Unspecified asthma, uncomplicated: J45.909

## 2015-08-02 HISTORY — DX: Long term (current) use of insulin: Z79.4

## 2015-08-02 HISTORY — DX: Other intervertebral disc degeneration, lumbosacral region: M51.37

## 2015-08-02 HISTORY — DX: Chronic obstructive pulmonary disease, unspecified: J44.9

## 2015-08-02 HISTORY — DX: Abdominal aortic aneurysm, without rupture, unspecified: I71.40

## 2015-08-02 HISTORY — DX: Type 2 diabetes mellitus with diabetic neuropathy, unspecified: E11.40

## 2015-08-02 HISTORY — DX: Abdominal aortic aneurysm, without rupture: I71.4

## 2015-08-02 HISTORY — DX: Dysthymic disorder: F34.1

## 2015-08-02 HISTORY — DX: Hypercalcemia: E83.52

## 2015-08-05 DIAGNOSIS — R5383 Other fatigue: Secondary | ICD-10-CM

## 2015-08-05 HISTORY — DX: Other fatigue: R53.83

## 2015-10-03 DIAGNOSIS — Z6833 Body mass index (BMI) 33.0-33.9, adult: Secondary | ICD-10-CM

## 2015-10-03 DIAGNOSIS — E661 Drug-induced obesity: Secondary | ICD-10-CM

## 2015-10-03 DIAGNOSIS — E66811 Obesity, class 1: Secondary | ICD-10-CM | POA: Insufficient documentation

## 2015-10-03 DIAGNOSIS — E669 Obesity, unspecified: Secondary | ICD-10-CM

## 2015-10-03 HISTORY — DX: Drug-induced obesity: E66.1

## 2015-10-03 HISTORY — DX: Body mass index (BMI) 33.0-33.9, adult: Z68.33

## 2015-10-03 HISTORY — DX: Obesity, unspecified: E66.9

## 2016-11-20 DIAGNOSIS — R32 Unspecified urinary incontinence: Secondary | ICD-10-CM

## 2016-11-20 HISTORY — DX: Unspecified urinary incontinence: R32

## 2017-02-01 DIAGNOSIS — Z79899 Other long term (current) drug therapy: Secondary | ICD-10-CM

## 2017-02-01 DIAGNOSIS — G2581 Restless legs syndrome: Secondary | ICD-10-CM

## 2017-02-01 HISTORY — DX: Other long term (current) drug therapy: Z79.899

## 2017-02-01 HISTORY — DX: Restless legs syndrome: G25.81

## 2017-02-03 DIAGNOSIS — E538 Deficiency of other specified B group vitamins: Secondary | ICD-10-CM | POA: Insufficient documentation

## 2017-02-03 HISTORY — DX: Deficiency of other specified B group vitamins: E53.8

## 2017-05-11 DIAGNOSIS — H60543 Acute eczematoid otitis externa, bilateral: Secondary | ICD-10-CM

## 2017-05-11 HISTORY — DX: Acute eczematoid otitis externa, bilateral: H60.543

## 2017-08-27 DIAGNOSIS — H6983 Other specified disorders of Eustachian tube, bilateral: Secondary | ICD-10-CM

## 2017-08-27 DIAGNOSIS — H6993 Unspecified Eustachian tube disorder, bilateral: Secondary | ICD-10-CM

## 2017-08-27 HISTORY — DX: Other specified disorders of eustachian tube, bilateral: H69.83

## 2017-08-27 HISTORY — DX: Unspecified eustachian tube disorder, bilateral: H69.93

## 2017-11-30 DIAGNOSIS — E785 Hyperlipidemia, unspecified: Secondary | ICD-10-CM | POA: Insufficient documentation

## 2017-11-30 HISTORY — DX: Hyperlipidemia, unspecified: E78.5

## 2018-01-25 ENCOUNTER — Encounter: Payer: Self-pay | Admitting: *Deleted

## 2018-01-25 ENCOUNTER — Other Ambulatory Visit: Payer: Self-pay | Admitting: *Deleted

## 2018-02-04 ENCOUNTER — Ambulatory Visit: Admitting: Cardiology

## 2018-03-10 ENCOUNTER — Ambulatory Visit (INDEPENDENT_AMBULATORY_CARE_PROVIDER_SITE_OTHER): Payer: Medicare Other | Admitting: Cardiology

## 2018-03-10 ENCOUNTER — Encounter: Payer: Self-pay | Admitting: Cardiology

## 2018-03-10 VITALS — BP 140/68 | HR 63 | Ht 64.0 in | Wt 236.8 lb

## 2018-03-10 DIAGNOSIS — I10 Essential (primary) hypertension: Secondary | ICD-10-CM

## 2018-03-10 DIAGNOSIS — F17200 Nicotine dependence, unspecified, uncomplicated: Secondary | ICD-10-CM

## 2018-03-10 DIAGNOSIS — G4733 Obstructive sleep apnea (adult) (pediatric): Secondary | ICD-10-CM | POA: Diagnosis not present

## 2018-03-10 DIAGNOSIS — I2510 Atherosclerotic heart disease of native coronary artery without angina pectoris: Secondary | ICD-10-CM | POA: Diagnosis not present

## 2018-03-10 DIAGNOSIS — I714 Abdominal aortic aneurysm, without rupture, unspecified: Secondary | ICD-10-CM

## 2018-03-10 DIAGNOSIS — Z9989 Dependence on other enabling machines and devices: Secondary | ICD-10-CM

## 2018-03-10 LAB — BASIC METABOLIC PANEL
BUN/Creatinine Ratio: 22 (ref 12–28)
BUN: 14 mg/dL (ref 8–27)
CO2: 23 mmol/L (ref 20–29)
Calcium: 9.5 mg/dL (ref 8.7–10.3)
Chloride: 103 mmol/L (ref 96–106)
Creatinine, Ser: 0.64 mg/dL (ref 0.57–1.00)
GFR calc Af Amer: 111 mL/min/{1.73_m2} (ref >59)
GFR calc non Af Amer: 96 mL/min/{1.73_m2} (ref >59)
Glucose: 134 mg/dL — ABNORMAL HIGH (ref 65–99)
Potassium: 4.2 mmol/L (ref 3.5–5.2)
Sodium: 143 mmol/L (ref 134–144)

## 2018-03-10 LAB — LIPID PANEL
Chol/HDL Ratio: 5.3 ratio — ABNORMAL HIGH (ref 0.0–4.4)
Cholesterol, Total: 201 mg/dL — ABNORMAL HIGH (ref 100–199)
HDL: 38 mg/dL — ABNORMAL LOW (ref >39)
LDL Calculated: 103 mg/dL — ABNORMAL HIGH (ref 0–99)
Triglycerides: 301 mg/dL — ABNORMAL HIGH (ref 0–149)
VLDL Cholesterol Cal: 60 mg/dL — ABNORMAL HIGH (ref 5–40)

## 2018-03-10 NOTE — Patient Instructions (Signed)
Medication Instructions:  Your physician recommends that you continue on your current medications as directed. Please refer to the Current Medication list given to you today.   Labwork: Your physician recommends that you return for lab work today: BMP, and Lipids.   Testing/Procedures: Your physician has requested that you have an echocardiogram. Echocardiography is a painless test that uses sound waves to create images of your heart. It provides your doctor with information about the size and shape of your heart and how well your heart's chambers and valves are working. This procedure takes approximately .one hour. There are no restrictions for this procedure.   Your physician has requested that you have an abdominal aorta duplex. During this test, an ultrasound is used to evaluate the aorta. Allow 30 minutes for this exam. Do not eat after midnight the day before and avoid carbonated beverages    Follow-Up: Your physician wants you to follow-up in: 5 months. You will receive a reminder letter in the mail two months in advance. If you don't receive a letter, please call our office to schedule the follow-up appointment.   Any Other Special Instructions Will Be Listed Below (If Applicable).     If you need a refill on your cardiac medications before your next appointment, please call your pharmacy.   Echocardiogram An echocardiogram, or echocardiography, uses sound waves (ultrasound) to produce an image of your heart. The echocardiogram is simple, painless, obtained within a short period of time, and offers valuable information to your health care provider. The images from an echocardiogram can provide information such as:  Evidence of coronary artery disease (CAD).  Heart size.  Heart muscle function.  Heart valve function.  Aneurysm detection.  Evidence of a past heart attack.  Fluid buildup around the heart.  Heart muscle thickening.  Assess heart valve  function.  Tell a health care provider about:  Any allergies you have.  All medicines you are taking, including vitamins, herbs, eye drops, creams, and over-the-counter medicines.  Any problems you or family members have had with anesthetic medicines.  Any blood disorders you have.  Any surgeries you have had.  Any medical conditions you have.  Whether you are pregnant or may be pregnant. What happens before the procedure? No special preparation is needed. Eat and drink normally. What happens during the procedure?  In order to produce an image of your heart, gel will be applied to your chest and a wand-like tool (transducer) will be moved over your chest. The gel will help transmit the sound waves from the transducer. The sound waves will harmlessly bounce off your heart to allow the heart images to be captured in real-time motion. These images will then be recorded.  You may need an IV to receive a medicine that improves the quality of the pictures. What happens after the procedure? You may return to your normal schedule including diet, activities, and medicines, unless your health care provider tells you otherwise. This information is not intended to replace advice given to you by your health care provider. Make sure you discuss any questions you have with your health care provider. Document Released: 06/12/2000 Document Revised: 02/01/2016 Document Reviewed: 02/20/2013 Elsevier Interactive Patient Education  2017 [employer]   Abdominal Aortic Aneurysm Blood pumps away from the heart through tubes (blood vessels) called arteries. Aneurysms are weak or damaged places in the wall of an artery. It bulges out like a balloon. An abdominal aortic aneurysm happens in the main artery of the body (  aorta). It can burst or tear, causing bleeding inside the body. This is an emergency. It needs treatment right away. What are the causes? The exact cause is unknown. Things that could cause  this problem include:  Fat and other substances building up in the lining of a tube.  Swelling of the walls of a blood vessel.  Certain tissue diseases.  Belly (abdominal) trauma.  An infection in the main artery of the body.  What increases the risk? There are things that make it more likely for you to have an aneurysm. These include:  Being over the age of 62 years old.  Having high blood pressure (hypertension).  Being a female.  Being white.  Being very overweight (obese).  Having a family history of aneurysm.  Using tobacco products.  What are the signs or symptoms? Symptoms depend on the size of the aneurysm and how fast it grows. There may not be symptoms. If symptoms occur, they can include:  Pain (belly, side, lower back, or groin).  Feeling full after eating a small amount of food.  Feeling sick to your stomach (nauseous), throwing up (vomiting), or both.  Feeling a lump in your belly that feels like it is beating (pulsating).  Feeling like you will pass out (faint).  How is this treated?  Medicine to control blood pressure and pain.  Imaging tests to see if the aneurysm gets bigger.  Surgery. How is this prevented? To lessen your chance of getting this condition:  Stop smoking. Stop chewing tobacco.  Limit or avoid alcohol.  Keep your blood pressure, blood sugar, and cholesterol within normal limits.  Eat less salt.  Eat foods low in saturated fats and cholesterol. These are found in animal and whole dairy products.  Eat more fiber. Fiber is found in whole grains, vegetables, and fruits.  Keep a healthy weight.  Stay active and exercise often.  This information is not intended to replace advice given to you by your health care provider. Make sure you discuss any questions you have with your health care provider. Document Released: 10/10/2012 Document Revised: 11/21/2015 Document Reviewed: 07/15/2012 Elsevier Interactive Patient Education   2017 [employer].

## 2018-03-10 NOTE — Progress Notes (Signed)
Cardiology Office Note:    Date:  03/10/2018   ID:  Deanna Dunn, DOB [birth year], MRN [id number]  PCP:  Romero Belling, MD  Cardiologist:  Gypsy Balsam, MD    Referring MD: Rhea Bleacher*   Chief Complaint  Patient presents with  . Follow-up  Doing well cardiac wise  History of Present Illness:    Deanna Dunn is a 62 y.o. female with multiple medical issues.  Also some issue with noncompliance.  She is does have diabetes which is poorly controlled.  On top of that hypertension chronic back problem which prevent her from exercising on a regular basis.  Denies having any chest pain tightness squeezing pressure burning chest.  Looks like recently she has been struggling with diarrhea but is ongoing problem overall seems to be doing better.  She admits that sometimes she eats a lot of cookies.  She also admits that her diabetes is not well controlled because of that.  She quit smoking and I congratulated her for it.  Past Medical History:  Diagnosis Date  . Abdominal aortic aneurysm without rupture (HCC) 08/02/2015   Last Assessment & Plan:  Relevant Hx: Course: Daily Update: Today's Plan:this has been followed through cardio and she again is advised that she needs to stop smoking  Electronically signed by: Krystal Clark, NP 08/05/15 1019  . Asthma 08/02/2015   Last Assessment & Plan:  Relevant Hx: Course: Daily Update: Today's Plan:this is stable for her at this time  Electronically signed by: Krystal Clark, NP 08/05/15 1021  . CHEST PAIN-UNSPECIFIED 09/18/2009   Qualifier: Diagnosis of  By: Jolene Provost   . COPD (chronic obstructive pulmonary disease) with chronic bronchitis (HCC) 08/02/2015   Last Assessment & Plan:  Relevant Hx: Course: Daily Update: Today's Plan:stable for her as can be with her continued smoking not using any inhalers at this time  Electronically signed by: Krystal Clark, NP 08/05/15 1020  . CORONARY  ARTERY DISEASE 03/28/2007   Qualifier: Diagnosis of  By: Charlsie Quest RMA, Lucy    . DDD (degenerative disc disease), lumbosacral 08/02/2015   Last Assessment & Plan:  Relevant Hx: Course: Daily Update: Today's Plan:chronic for her with pain management through in town provider   Electronically signed by: Krystal Clark, NP 08/05/15 1023  . DIABETES MELLITUS, TYPE I 03/28/2007   Qualifier: Diagnosis of  By: Charlsie Quest RMA, Lucy    . Diabetic autonomic neuropathy (HCC) 08/02/2015  . Diastolic heart failure    mild  . Dysfunction of both eustachian tubes 08/27/2017  . Dyslipidemia 11/30/2017  . 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 Sandria Manly for mental health in [place] she states and her abilify was increased to 10mg  though she does not know when  Electronically signed by: Krystal Clark, NP 08/05/15 1025  . Eczema of both external ears 05/11/2017  . Fatigue 08/05/2015   Last Assessment & Plan:  Relevant Hx: Course: Daily Update: Today's Plan:chronic to some degree for her and tied to her multipharmacy and we discussed her smoking, her inactivity and she is aware of all of this  Electronically signed by: Krystal Clark, NP 08/05/15 1026  . GERD 10/31/2009   Qualifier: Diagnosis of  By: Excell Seltzer, MD, [place]   . High risk medication use 02/01/2017  . Hypercalcemia 08/02/2015  . HYPERLIPIDEMIA 03/28/2007   Qualifier: Diagnosis of  By: Tyrone Apple, Lucy    .  HYPERTENSION 03/28/2007   Qualifier: Diagnosis of  By: Tyrone AppleBrand RMA, Lucy    . HYPOTHYROIDISM 03/28/2007   Qualifier: Diagnosis of  By: Tyrone AppleBrand RMA, Lucy    . Long term (current) use of insulin (HCC) 08/02/2015   Last Assessment & Plan:  Relevant Hx: Course: Daily Update: Today's Plan:follows through endocrine  Electronically signed by: Krystal ClarkMelissa Joyce Brown-Patram, NP 08/05/15 1025  . Major depressive disorder, recurrent, mild (HCC) 08/01/2015  . Obesity   .  Obesity (BMI 30-39.9) 10/03/2015  . Obstructive sleep apnea   . OSA on CPAP 08/02/2015   Last Assessment & Plan:  Relevant Hx: Course: Daily Update: Today's Plan:she is wearing her bipap/cpap and follows with Dr.Chodri for this not sure of her settings but she is compliant with use nightly  Electronically signed by: Krystal ClarkMelissa Joyce Brown-Patram, NP 08/05/15 1020  . Osteoarthritis, generalized 08/02/2015   Last Assessment & Plan:  Relevant Hx: Course: Daily Update: Today's Plan:weight complicates her picture for her and she is having difficulty with getting around but using her walker for support   Electronically signed by: Krystal ClarkMelissa Joyce Brown-Patram, NP 08/05/15 1024  . OVERWEIGHT/OBESITY 09/18/2009   Qualifier: Diagnosis of  By: Jolene ProvostLenze, PA-C, Michele Mullaney   . Restless leg syndrome 02/01/2017  . TOBACCO ABUSE 10/31/2009   Qualifier: Diagnosis of  By: Excell Seltzerooper, MD, [place]   . Tobacco user   . Urinary incontinence 11/20/2016  . Vitamin B12 deficiency 02/03/2017  . Vitamin D deficiency 08/02/2015   Last Assessment & Plan:  Relevant Hx: Course: Daily Update: Today's Plan:she is taking this weekly  Electronically signed by: Krystal ClarkMelissa Joyce Brown-Patram, NP 08/05/15 1025    Past Surgical History:  Procedure Laterality Date  . CARDIAC CATHETERIZATION  08/2009   instent restenosis and thrombosis in RCA, 70% mid LAD stenosis  . CHOLECYSTECTOMY    . CORONARY ANGIOPLASTY WITH STENT PLACEMENT  08/2009   RCA: 3.5 X 23 mm Promus DES.  Marland Kitchen. ENDOSCOPIC HEMILAMINOTOMY W/ DISCECTOMY LUMBAR    . HEMIARTHROPLASTY HIP Right 07/09/2015   Displaced intertrochanteric fx of rt femur with progression to the hemiarthroplasty  . TUBAL LIGATION      Current Medications: Current Meds  Medication Sig  . Acetaminophen (TYLENOL ARTHRITIS PAIN PO) Take 650 mg by mouth at bedtime. To take 2 tablets every night at bedtime.   Marland Kitchen. ADVAIR HFA 115-21 MCG/ACT inhaler Inhale 2 puffs into the lungs daily.  . Albuterol Sulfate (PROAIR HFA  IN) Inhale 2 puffs into the lungs every 4 (four) hours as needed.    . ARIPiprazole (ABILIFY) 5 MG tablet Take 2 a day  . aspirin 81 MG tablet Take 81 mg by mouth daily.    Marland Kitchen. atorvastatin (LIPITOR) 80 MG tablet Take 80 mg by mouth daily.    . Blood Glucose Monitoring Suppl (GLUCOCOM BLOOD GLUCOSE MONITOR) DEVI by Misc.(Non-Drug; Combo Route) route.  Marland Kitchen. buPROPion (WELLBUTRIN SR) 150 MG 12 hr tablet Take 150 mg by mouth 2 (two) times daily.    . calcium carbonate (OS-CAL) 600 MG TABS Take 600 mg by mouth 2 (two) times daily with a meal.    . cholecalciferol (VITAMIN D) 1000 UNITS tablet Take 1,000 Units by mouth 2 (two) times daily.    . clonazePAM (KLONOPIN) 1 MG tablet Take 1 tablet by mouth 3 (three) times daily.  . clotrimazole (MYCELEX) 10 MG troche Take 10 mg by mouth 5 (five) times daily as needed.    . Continuous Blood Gluc Receiver (FREESTYLE LIBRE 14 DAY  READER) DEVI 1 Units by Misc.(Non-Drug; Combo Route) route as needed.  . Dulaglutide 1.5 MG/0.5ML SOPN Inject into the skin.  . DULoxetine (CYMBALTA) 60 MG capsule Take 60 mg by mouth 2 (two) times daily.    Marland Kitchen EST ESTROGENS-METHYLTEST DS PO Take 1 tablet by mouth daily.    . fenofibrate 160 MG tablet Take 160 mg by mouth daily.    . fish oil-omega-3 fatty acids 1000 MG capsule Take 1 g by mouth 2 (two) times daily.   . fluticasone (FLONASE) 50 MCG/ACT nasal spray Place 2 sprays into both nostrils daily.  . folic acid (FOLVITE) 1 MG tablet Take 1 mg by mouth daily.    . furosemide (LASIX) 40 MG tablet Take 40 mg by mouth 2 (two) times daily.   Marland Kitchen glucose blood (FREESTYLE INSULINX TEST) test strip Check BG 4 times/day.  Dx E11.65  . Insulin Glargine (LANTUS SOLOSTAR) 100 UNIT/ML Solostar Pen Inject into the skin.  Marland Kitchen insulin lispro (HUMALOG) 100 UNIT/ML injection Inject 28 Units into the skin 3 (three) times daily before meals.    . isosorbide mononitrate (IMDUR) 30 MG 24 hr tablet Take 1 tablet (30 mg total) by mouth daily.  Marland Kitchen  levothyroxine (LEVOXYL) 200 MCG tablet TAKE 1 TABLET DAILY AT 0600  . magnesium oxide (MAG-OX) 400 MG tablet Take by mouth.  . medroxyPROGESTERone (PROVERA) 10 MG tablet Take 10 mg by mouth daily.    . metoprolol succinate (TOPROL-XL) 100 MG 24 hr tablet Take by mouth.  . nitroGLYCERIN (NITROSTAT) 0.4 MG SL tablet Place 0.4 mg under the tongue every 5 (five) minutes as needed.    Marland Kitchen omeprazole (PRILOSEC) 20 MG capsule Take 40 mg by mouth 2 (two) times daily.    Marland Kitchen oxyCODONE-acetaminophen (PERCOCET) 7.5-325 MG per tablet Take 1 tablet by mouth every 6 (six) hours as needed.    . potassium chloride SA (K-DUR,KLOR-CON) 20 MEQ tablet Take by mouth.  . pregabalin (LYRICA) 225 MG capsule Take by mouth.  . spironolactone-hydrochlorothiazide (ALDACTAZIDE) 25-25 MG tablet TAKE 1 TABLET DAILY  . triamcinolone (KENALOG) 0.5 % cream Apply topically as needed.    . Vitamin D, Ergocalciferol, (DRISDOL) 50000 units CAPS capsule TAKE 1 CAPSULE EVERY 7 DAYS     Allergies:   Sulfamethoxazole; Actos [pioglitazone hydrochloride]; Amlodipine; Amoxicillin; Ampicillin; Codeine; Fentanyl; Pioglitazone; and Sulfonamide derivatives   Social History   Socioeconomic History  . Marital status: Married    Spouse name: Not on file  . Number of children: Not on file  . Years of education: Not on file  . Highest education level: Not on file  Occupational History  . Not on file  Social Needs  . Financial resource strain: Not on file  . Food insecurity:    Worry: Not on file    Inability: Not on file  . Transportation needs:    Medical: Not on file    Non-medical: Not on file  Tobacco Use  . Smoking status: Former Smoker    Types: Cigarettes  . Smokeless tobacco: Never Used  Substance and Sexual Activity  . Alcohol use: Not Currently  . Drug use: Never  . Sexual activity: Not on file  Lifestyle  . Physical activity:    Days per week: Not on file    Minutes per session: Not on file  . Stress: Not on file    Relationships  . Social connections:    Talks on phone: Not on file    Gets together: Not on file  Attends religious service: Not on file    Active member of club or organization: Not on file    Attends meetings of clubs or organizations: Not on file    Relationship status: Not on file  Other Topics Concern  . Not on file  Social History Narrative  . Not on file     Family History: The patient's family history includes Diabetes in her mother; Heart attack in her father and mother; Thyroid disease in her mother. ROS:   Please see the history of present illness.    All 14 point review of systems negative except as described per history of present illness  EKGs/Labs/Other Studies Reviewed:      Recent Labs: No results found for requested labs within last 8760 hours.  Recent Lipid Panel    Component Value Date/Time   CHOL  09/03/2009 0357    154        ATP III CLASSIFICATION:  <200     mg/dL   Desirable  161-096  mg/dL   Borderline High  >=045    mg/dL   High          TRIG 409 (H) 09/03/2009 0357   HDL 26 (L) 09/03/2009 0357   CHOLHDL 5.9 09/03/2009 0357   VLDL UNABLE TO CALCULATE IF TRIGLYCERIDE OVER 400 mg/dL 81/19/1478 2956   LDLCALC  09/03/2009 0357    UNABLE TO CALCULATE IF TRIGLYCERIDE OVER 400 mg/dL        Total Cholesterol/HDL:CHD Risk Coronary Heart Disease Risk Table                     Men   Women  1/2 Average Risk   3.4   3.3  Average Risk       5.0   4.4  2 X Average Risk   9.6   7.1  3 X Average Risk  23.4   11.0        Use the calculated Patient Ratio above and the CHD Risk Table to determine the patient's CHD Risk.        ATP III CLASSIFICATION (LDL):  <100     mg/dL   Optimal  213-086  mg/dL   Near or Above                    Optimal  130-159  mg/dL   Borderline  578-469  mg/dL   High  >629     mg/dL   Very High    Physical Exam:    VS:  BP 140/68   Pulse 63   Ht 5\' 4"  (1.626 m)   Wt 236 lb 12.8 oz (107.4 kg)   SpO2 94%   BMI  40.65 kg/m     Wt Readings from Last 3 Encounters:  03/10/18 236 lb 12.8 oz (107.4 kg)  10/30/10 221 lb (100.2 kg)     GEN:  Well nourished, well developed in no acute distress HEENT: Normal NECK: No JVD; No carotid bruits LYMPHATICS: No lymphadenopathy CARDIAC: RRR, no murmurs, no rubs, no gallops RESPIRATORY:  Clear to auscultation without rales, wheezing or rhonchi  ABDOMEN: Soft, non-tender, non-distended MUSCULOSKELETAL:  No edema; No deformity  SKIN: Warm and dry LOWER EXTREMITIES: no swelling NEUROLOGIC:  Alert and oriented x 3 PSYCHIATRIC:  Normal affect   ASSESSMENT:    1. Abdominal aortic aneurysm without rupture (HCC)   2. Atherosclerosis of native coronary artery of native heart without angina pectoris   3. Coronary artery  disease involving native coronary artery of native heart without angina pectoris   4. OSA on CPAP   5. Essential hypertension   6. TOBACCO ABUSE    PLAN:    In order of problems listed above:  1. Abdominal ultrasound was we will schedule her to have abdominal aortic aneurysm ultrasound. 2. Coronary artery disease with completely occluded right coronary artery with collateral station.  She is asymptomatic we will continue aggressive risk factors modifications. 3. Obstructive sleep apnea she does use CPAP mask she is very happy with it 4. Essential hypertension blood pressure well controlled continue present management. 5. She quit smoking many months ago and again I congratulated her for it. 6. I will ask her to have an echocardiogram to assess left ventricular ejection fraction as well as right ventricle size and function and pulmonary hypertension.   Medication Adjustments/Labs and Tests Ordered: Current medicines are reviewed at length with the patient today.  Concerns regarding medicines are outlined above.  No orders of the defined types were placed in this encounter.  Medication changes: No orders of the defined types were placed in  this encounter.   Signed, Georgeanna Lea, MD, [facility] 03/10/2018 9:18 AM    [facility]

## 2018-03-15 ENCOUNTER — Telehealth: Payer: Self-pay | Admitting: Cardiology

## 2018-03-15 DIAGNOSIS — E785 Hyperlipidemia, unspecified: Secondary | ICD-10-CM

## 2018-03-15 MED ORDER — EZETIMIBE 10 MG PO TABS: 10.0000 mg | ORAL_TABLET | Freq: Every day | ORAL | 3 refills | Status: AC

## 2018-03-15 NOTE — Addendum Note (Signed)
Addended by: Vanessa DurhamBOWMAN, Loran Fleet R on: 03/15/2018 10:50 AM   Modules accepted: Orders

## 2018-03-15 NOTE — Telephone Encounter (Signed)
Patient informed of lab results. Patient informed to start zetia 10 mg daily as well as fasting lipid profile and ast and alt in 6 weeks. Patient verbally understands.

## 2018-03-15 NOTE — Telephone Encounter (Signed)
Patient is calling for her lab results.  

## 2018-03-26 DIAGNOSIS — R079 Chest pain, unspecified: Secondary | ICD-10-CM

## 2018-03-26 DIAGNOSIS — F418 Other specified anxiety disorders: Secondary | ICD-10-CM | POA: Diagnosis not present

## 2018-03-26 DIAGNOSIS — R197 Diarrhea, unspecified: Secondary | ICD-10-CM

## 2018-03-26 DIAGNOSIS — R112 Nausea with vomiting, unspecified: Secondary | ICD-10-CM | POA: Diagnosis not present

## 2018-03-26 DIAGNOSIS — J441 Chronic obstructive pulmonary disease with (acute) exacerbation: Secondary | ICD-10-CM

## 2018-03-26 DIAGNOSIS — J9601 Acute respiratory failure with hypoxia: Secondary | ICD-10-CM

## 2018-03-26 DIAGNOSIS — L920 Granuloma annulare: Secondary | ICD-10-CM

## 2018-03-26 DIAGNOSIS — E119 Type 2 diabetes mellitus without complications: Secondary | ICD-10-CM

## 2018-03-26 DIAGNOSIS — J449 Chronic obstructive pulmonary disease, unspecified: Secondary | ICD-10-CM

## 2018-03-26 DIAGNOSIS — I2510 Atherosclerotic heart disease of native coronary artery without angina pectoris: Secondary | ICD-10-CM

## 2018-03-26 DIAGNOSIS — R8290 Unspecified abnormal findings in urine: Secondary | ICD-10-CM | POA: Diagnosis not present

## 2018-03-27 DIAGNOSIS — R8290 Unspecified abnormal findings in urine: Secondary | ICD-10-CM | POA: Diagnosis not present

## 2018-03-27 DIAGNOSIS — F418 Other specified anxiety disorders: Secondary | ICD-10-CM | POA: Diagnosis not present

## 2018-03-27 DIAGNOSIS — R112 Nausea with vomiting, unspecified: Secondary | ICD-10-CM | POA: Diagnosis not present

## 2018-03-27 DIAGNOSIS — R197 Diarrhea, unspecified: Secondary | ICD-10-CM | POA: Diagnosis not present

## 2018-03-28 DIAGNOSIS — J441 Chronic obstructive pulmonary disease with (acute) exacerbation: Secondary | ICD-10-CM | POA: Diagnosis not present

## 2018-04-12 DIAGNOSIS — E1165 Type 2 diabetes mellitus with hyperglycemia: Secondary | ICD-10-CM

## 2018-04-12 DIAGNOSIS — E785 Hyperlipidemia, unspecified: Secondary | ICD-10-CM

## 2018-04-12 DIAGNOSIS — E039 Hypothyroidism, unspecified: Secondary | ICD-10-CM

## 2018-04-12 DIAGNOSIS — J449 Chronic obstructive pulmonary disease, unspecified: Secondary | ICD-10-CM | POA: Diagnosis not present

## 2018-04-12 DIAGNOSIS — G9340 Encephalopathy, unspecified: Secondary | ICD-10-CM | POA: Diagnosis not present

## 2018-04-12 DIAGNOSIS — K219 Gastro-esophageal reflux disease without esophagitis: Secondary | ICD-10-CM

## 2018-04-12 DIAGNOSIS — E860 Dehydration: Secondary | ICD-10-CM

## 2018-04-12 DIAGNOSIS — N390 Urinary tract infection, site not specified: Secondary | ICD-10-CM | POA: Diagnosis not present

## 2018-04-13 DIAGNOSIS — J449 Chronic obstructive pulmonary disease, unspecified: Secondary | ICD-10-CM | POA: Diagnosis not present

## 2018-04-13 DIAGNOSIS — G9340 Encephalopathy, unspecified: Secondary | ICD-10-CM | POA: Diagnosis not present

## 2018-04-13 DIAGNOSIS — N390 Urinary tract infection, site not specified: Secondary | ICD-10-CM | POA: Diagnosis not present

## 2018-04-13 DIAGNOSIS — E860 Dehydration: Secondary | ICD-10-CM | POA: Diagnosis not present

## 2018-04-14 DIAGNOSIS — J449 Chronic obstructive pulmonary disease, unspecified: Secondary | ICD-10-CM | POA: Diagnosis not present

## 2018-04-14 DIAGNOSIS — G9340 Encephalopathy, unspecified: Secondary | ICD-10-CM | POA: Diagnosis not present

## 2018-04-14 DIAGNOSIS — E860 Dehydration: Secondary | ICD-10-CM | POA: Diagnosis not present

## 2018-04-14 DIAGNOSIS — N390 Urinary tract infection, site not specified: Secondary | ICD-10-CM | POA: Diagnosis not present

## 2018-04-15 DIAGNOSIS — N390 Urinary tract infection, site not specified: Secondary | ICD-10-CM | POA: Diagnosis not present

## 2018-04-15 DIAGNOSIS — G9340 Encephalopathy, unspecified: Secondary | ICD-10-CM | POA: Diagnosis not present

## 2018-04-15 DIAGNOSIS — E860 Dehydration: Secondary | ICD-10-CM | POA: Diagnosis not present

## 2018-04-15 DIAGNOSIS — J449 Chronic obstructive pulmonary disease, unspecified: Secondary | ICD-10-CM | POA: Diagnosis not present

## 2018-04-28 ENCOUNTER — Other Ambulatory Visit: Payer: Medicare Other

## 2018-04-28 DIAGNOSIS — Z01818 Encounter for other preprocedural examination: Secondary | ICD-10-CM | POA: Diagnosis not present

## 2018-08-10 ENCOUNTER — Encounter: Payer: Self-pay | Admitting: Cardiology

## 2018-08-10 ENCOUNTER — Ambulatory Visit (INDEPENDENT_AMBULATORY_CARE_PROVIDER_SITE_OTHER): Payer: Medicare Other | Admitting: Cardiology

## 2018-08-10 VITALS — BP 130/76 | HR 90 | Ht 63.0 in | Wt 213.4 lb

## 2018-08-10 DIAGNOSIS — E785 Hyperlipidemia, unspecified: Secondary | ICD-10-CM | POA: Diagnosis not present

## 2018-08-10 DIAGNOSIS — I714 Abdominal aortic aneurysm, without rupture, unspecified: Secondary | ICD-10-CM

## 2018-08-10 DIAGNOSIS — I10 Essential (primary) hypertension: Secondary | ICD-10-CM

## 2018-08-10 DIAGNOSIS — I2510 Atherosclerotic heart disease of native coronary artery without angina pectoris: Secondary | ICD-10-CM | POA: Diagnosis not present

## 2018-08-10 NOTE — Progress Notes (Signed)
Cardiology Office Note:    Date:  08/10/2018   ID:  Deanna Dunn, DOB Jun 29, 1956, MRN [id number]  PCP:  Gordan Payment., MD  Cardiologist:  Gypsy Balsam, MD    Referring MD: Romero Belling, MD   Chief Complaint  Patient presents with  . Follow-up  Doing well  History of Present Illness:    Deanna Dunn is a 63 y.o. female artery disease, status post PTCA and stenting.  She does have history of abdominal aneurysm.  Recent evaluation done in form of CT of her abdomen showed only small abdominal aneurysm.  She does have also history of coronary artery disease and she was referred to [place] for evaluation.  Seems to be doing well recently ended going to the hospital because of chronic back problem she was identified to have some broken vertebrae.  Kyphoplasty was attempted however according to patient was unsuccessful.  She is gradually getting better.  She does have a problem with her diabetes which is still poorly controlled many times it is related to her dietary indiscretions.  We talked about the importance of taking her diabetes the best way possible.  She does take cholesterol-lowering medication however her last cholesterol however which is from last year is still not sufficiently controlled.  She may be required addition of Zetia may be even PCSK9 agent.  Denies have any chest pain tightness squeezing pressure been chest she just does have shortness of breath and fatigue.  Past Medical History:  Diagnosis Date  . Abdominal aortic aneurysm without rupture (HCC) 08/02/2015   Last Assessment & Plan:  Relevant Hx: Course: Daily Update: Today's Plan:this has been followed through cardio and she again is advised that she needs to stop smoking  Electronically signed by: Krystal Clark, NP 08/05/15 1019  . Asthma 08/02/2015   Last Assessment & Plan:  Relevant Hx: Course: Daily Update: Today's Plan:this is stable for her at this time  Electronically signed by: Krystal Clark, NP 08/05/15 1021  . CHEST PAIN-UNSPECIFIED 09/18/2009   Qualifier: Diagnosis of  By: Jolene Provost   . COPD (chronic obstructive pulmonary disease) with chronic bronchitis (HCC) 08/02/2015   Last Assessment & Plan:  Relevant Hx: Course: Daily Update: Today's Plan:stable for her as can be with her continued smoking not using any inhalers at this time  Electronically signed by: Krystal Clark, NP 08/05/15 1020  . CORONARY ARTERY DISEASE 03/28/2007   Qualifier: Diagnosis of  By: Charlsie Quest RMA, Lucy    . DDD (degenerative disc disease), lumbosacral 08/02/2015   Last Assessment & Plan:  Relevant Hx: Course: Daily Update: Today's Plan:chronic for her with pain management through in town provider   Electronically signed by: Krystal Clark, NP 08/05/15 1023  . DIABETES MELLITUS, TYPE I 03/28/2007   Qualifier: Diagnosis of  By: Charlsie Quest RMA, Lucy    . Diabetic autonomic neuropathy (HCC) 08/02/2015  . Diastolic heart failure    mild  . Dysfunction of both eustachian tubes 08/27/2017  . Dyslipidemia 11/30/2017  . 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 Sandria Manly for mental health in [place] she states and her abilify was increased to 10mg  though she does not know when  Electronically signed by: Krystal Clark, NP 08/05/15 1025  . Eczema of both external ears 05/11/2017  . Fatigue 08/05/2015   Last Assessment & Plan:  Relevant Hx: Course: Daily Update: Today's Plan:chronic to some degree  for her and tied to her multipharmacy and we discussed her smoking, her inactivity and she is aware of all of this  Electronically signed by: Krystal ClarkMelissa Joyce Brown-Patram, NP 08/05/15 1026  . GERD 10/31/2009   Qualifier: Diagnosis of  By: Excell Seltzerooper, MD, Vale HavenMichael David   . High risk medication use 02/01/2017  . Hypercalcemia 08/02/2015  . HYPERLIPIDEMIA 03/28/2007   Qualifier: Diagnosis of  By: Tyrone AppleBrand RMA, Lucy      . HYPERTENSION 03/28/2007   Qualifier: Diagnosis of  By: Tyrone AppleBrand RMA, Lucy    . HYPOTHYROIDISM 03/28/2007   Qualifier: Diagnosis of  By: Tyrone AppleBrand RMA, Lucy    . Long term (current) use of insulin (HCC) 08/02/2015   Last Assessment & Plan:  Relevant Hx: Course: Daily Update: Today's Plan:follows through endocrine  Electronically signed by: Krystal ClarkMelissa Joyce Brown-Patram, NP 08/05/15 1025  . Major depressive disorder, recurrent, mild (HCC) 08/01/2015  . Obesity   . Obesity (BMI 30-39.9) 10/03/2015  . Obstructive sleep apnea   . OSA on CPAP 08/02/2015   Last Assessment & Plan:  Relevant Hx: Course: Daily Update: Today's Plan:she is wearing her bipap/cpap and follows with Dr.Chodri for this not sure of her settings but she is compliant with use nightly  Electronically signed by: Krystal ClarkMelissa Joyce Brown-Patram, NP 08/05/15 1020  . Osteoarthritis, generalized 08/02/2015   Last Assessment & Plan:  Relevant Hx: Course: Daily Update: Today's Plan:weight complicates her picture for her and she is having difficulty with getting around but using her walker for support   Electronically signed by: Krystal ClarkMelissa Joyce Brown-Patram, NP 08/05/15 1024  . OVERWEIGHT/OBESITY 09/18/2009   Qualifier: Diagnosis of  By: Jolene ProvostLenze, PA-C, Michele Mullaney   . Restless leg syndrome 02/01/2017  . TOBACCO ABUSE 10/31/2009   Qualifier: Diagnosis of  By: Excell Seltzerooper, MD, Vale HavenMichael David   . Tobacco user   . Urinary incontinence 11/20/2016  . Vitamin B12 deficiency 02/03/2017  . Vitamin D deficiency 08/02/2015   Last Assessment & Plan:  Relevant Hx: Course: Daily Update: Today's Plan:she is taking this weekly  Electronically signed by: Krystal ClarkMelissa Joyce Brown-Patram, NP 08/05/15 1025    Past Surgical History:  Procedure Laterality Date  . CARDIAC CATHETERIZATION  08/2009   instent restenosis and thrombosis in RCA, 70% mid LAD stenosis  . CHOLECYSTECTOMY    . CORONARY ANGIOPLASTY WITH STENT PLACEMENT  08/2009   RCA: 3.5 X 23 mm Promus DES.  Marland Kitchen. ENDOSCOPIC HEMILAMINOTOMY  W/ DISCECTOMY LUMBAR    . HEMIARTHROPLASTY HIP Right 07/09/2015   Displaced intertrochanteric fx of rt femur with progression to the hemiarthroplasty  . TUBAL LIGATION      Current Medications: Current Meds  Medication Sig  . Acetaminophen (TYLENOL ARTHRITIS PAIN PO) Take 650 mg by mouth at bedtime. To take 2 tablets every night at bedtime.   Marland Kitchen. ADVAIR HFA 115-21 MCG/ACT inhaler Inhale 2 puffs into the lungs daily.  . Albuterol Sulfate (PROAIR HFA IN) Inhale 2 puffs into the lungs every 4 (four) hours as needed.    . ARIPiprazole (ABILIFY) 5 MG tablet Take 2 a day  . aspirin 81 MG tablet Take 81 mg by mouth daily.    Marland Kitchen. atorvastatin (LIPITOR) 80 MG tablet Take 80 mg by mouth daily.    . Blood Glucose Monitoring Suppl (GLUCOCOM BLOOD GLUCOSE MONITOR) DEVI by Misc.(Non-Drug; Combo Route) route.  Marland Kitchen. buPROPion (WELLBUTRIN SR) 150 MG 12 hr tablet Take 150 mg by mouth 2 (two) times daily.    . calcium carbonate (OS-CAL) 600 MG TABS Take  600 mg by mouth 2 (two) times daily with a meal.    . cholecalciferol (VITAMIN D) 1000 UNITS tablet Take 1,000 Units by mouth 2 (two) times daily.    . Continuous Blood Gluc Receiver (FREESTYLE LIBRE 14 DAY READER) DEVI 1 Units by Misc.(Non-Drug; Combo Route) route as needed.  . Dulaglutide 1.5 MG/0.5ML SOPN Inject into the skin.  . DULoxetine (CYMBALTA) 60 MG capsule Take 60 mg by mouth 2 (two) times daily.    Marland Kitchen ezetimibe (ZETIA) 10 MG tablet Take 1 tablet (10 mg total) by mouth daily.  . fenofibrate 160 MG tablet Take 160 mg by mouth daily.    . fluticasone (FLONASE) 50 MCG/ACT nasal spray Place 2 sprays into both nostrils daily.  . folic acid (FOLVITE) 1 MG tablet Take 1 mg by mouth daily.    . furosemide (LASIX) 40 MG tablet Take 40 mg by mouth 2 (two) times daily.   Marland Kitchen glucose blood (FREESTYLE INSULINX TEST) test strip Check BG 4 times/day.  Dx E11.65  . Insulin Glargine (LANTUS SOLOSTAR) 100 UNIT/ML Solostar Pen Inject 80 Units into the skin 2 (two) times  daily.   . insulin lispro (HUMALOG) 100 UNIT/ML injection Inject 22 Units into the skin 3 (three) times daily before meals.   . isosorbide mononitrate (IMDUR) 30 MG 24 hr tablet Take 1 tablet (30 mg total) by mouth daily.  Marland Kitchen levothyroxine (LEVOXYL) 200 MCG tablet TAKE 1 TABLET DAILY AT 0600  . magnesium oxide (MAG-OX) 400 MG tablet Take by mouth.  . metFORMIN (GLUCOPHAGE-XR) 500 MG 24 hr tablet Take 500 mg by mouth 2 (two) times daily.   . metoprolol succinate (TOPROL-XL) 100 MG 24 hr tablet Take by mouth.  . nitroGLYCERIN (NITROSTAT) 0.4 MG SL tablet Place 0.4 mg under the tongue every 5 (five) minutes as needed.    Marland Kitchen omeprazole (PRILOSEC) 20 MG capsule Take 40 mg by mouth 2 (two) times daily.    . potassium chloride SA (K-DUR,KLOR-CON) 20 MEQ tablet Take by mouth.  . pregabalin (LYRICA) 225 MG capsule Take 225 mg by mouth 2 (two) times daily.   Marland Kitchen spironolactone-hydrochlorothiazide (ALDACTAZIDE) 25-25 MG tablet TAKE 1 TABLET DAILY  . triamcinolone (KENALOG) 0.5 % cream Apply topically as needed.    . Vitamin D, Ergocalciferol, (DRISDOL) 50000 units CAPS capsule TAKE 1 CAPSULE EVERY 7 DAYS     Allergies:   Sulfamethoxazole; Actos [pioglitazone hydrochloride]; Amlodipine; Amoxicillin; Ampicillin; Codeine; Fentanyl; Pioglitazone; and Sulfonamide derivatives   Social History   Socioeconomic History  . Marital status: Married    Spouse name: Not on file  . Number of children: Not on file  . Years of education: Not on file  . Highest education level: Not on file  Occupational History  . Not on file  Social Needs  . Financial resource strain: Not on file  . Food insecurity:    Worry: Not on file    Inability: Not on file  . Transportation needs:    Medical: Not on file    Non-medical: Not on file  Tobacco Use  . Smoking status: Former Smoker    Types: Cigarettes  . Smokeless tobacco: Never Used  Substance and Sexual Activity  . Alcohol use: Not Currently  . Drug use: Never  .  Sexual activity: Not on file  Lifestyle  . Physical activity:    Days per week: Not on file    Minutes per session: Not on file  . Stress: Not on file  Relationships  .  Social connections:    Talks on phone: Not on file    Gets together: Not on file    Attends religious service: Not on file    Active member of club or organization: Not on file    Attends meetings of clubs or organizations: Not on file    Relationship status: Not on file  Other Topics Concern  . Not on file  Social History Narrative  . Not on file     Family History: The patient's family history includes Diabetes in her mother; Heart attack in her father and mother; Thyroid disease in her mother. ROS:   Please see the history of present illness.    All 14 point review of systems negative except as described per history of present illness  EKGs/Labs/Other Studies Reviewed:      Recent Labs: 03/10/2018: BUN 14; Creatinine, Ser 0.64; Potassium 4.2; Sodium 143  Recent Lipid Panel    Component Value Date/Time   CHOL 201 (H) 03/10/2018 0937   TRIG 301 (H) 03/10/2018 0937   HDL 38 (L) 03/10/2018 0937   CHOLHDL 5.3 (H) 03/10/2018 0937   CHOLHDL 5.9 09/03/2009 0357   VLDL UNABLE TO CALCULATE IF TRIGLYCERIDE OVER 400 mg/dL 40/98/119103/01/2010 [id number]   LDLCALC 103 (H) 03/10/2018 0937    Physical Exam:    VS:  BP 130/76   Pulse 90   Ht 5\' 3"  (1.6 m)   Wt 213 lb 6.4 oz (96.8 kg)   SpO2 94%   BMI 37.80 kg/m     Wt Readings from Last 3 Encounters:  08/10/18 213 lb 6.4 oz (96.8 kg)  03/10/18 236 lb 12.8 oz (107.4 kg)  10/30/10 221 lb (100.2 kg)     GEN:  Well nourished, well developed in no acute distress HEENT: Normal NECK: No JVD; No carotid bruits LYMPHATICS: No lymphadenopathy CARDIAC: RRR, no murmurs, no rubs, no gallops RESPIRATORY:  Clear to auscultation without rales, wheezing or rhonchi  ABDOMEN: Soft, non-tender, non-distended MUSCULOSKELETAL:  No edema; No deformity  SKIN: Warm and dry LOWER  EXTREMITIES: no swelling NEUROLOGIC:  Alert and oriented x 3 PSYCHIATRIC:  Normal affect   ASSESSMENT:    1. Coronary artery disease involving native coronary artery of native heart without angina pectoris   2. Essential hypertension   3. Abdominal aortic aneurysm without rupture (HCC)   4. Dyslipidemia    PLAN:    In order of problems listed above:  1. Coronary artery disease stable denies have any symptoms. 2. Essential hypertension blood pressure well controlled continue present management. 3. Abdominal aortic aneurysm measuring 3.5 cm.  Stable we will follow-up regularly. 4. Dyslipidemia.  Awaiting fasting lipid profile for primary care physician   Medication Adjustments/Labs and Tests Ordered: Current medicines are reviewed at length with the patient today.  Concerns regarding medicines are outlined above.  No orders of the defined types were placed in this encounter.  Medication changes: No orders of the defined types were placed in this encounter.   Signed, Georgeanna Leaobert J. , MD, [facility] 08/10/2018 3:38 PM    [facility]

## 2018-08-10 NOTE — Patient Instructions (Signed)
Medication Instructions:  Your physician recommends that you continue on your current medications as directed. Please refer to the Current Medication list given to you today.  If you need a refill on your cardiac medications before your next appointment, please call your pharmacy.   Lab work: None.  If you have labs (blood work) drawn today and your tests are completely normal, you will receive your results only by: . MyChart Message (if you have MyChart) OR . A paper copy in the mail If you have any lab test that is abnormal or we need to change your treatment, we will call you to review the results.  Testing/Procedures: Your physician has requested that you have an echocardiogram. Echocardiography is a painless test that uses sound waves to create images of your heart. It provides your doctor with information about the size and shape of your heart and how well your heart's chambers and valves are working. This procedure takes approximately one hour. There are no restrictions for this procedure.    Follow-Up: At [facility], you and your health needs are our priority.  As part of our continuing mission to provide you with exceptional heart care, we have created designated Provider Care Teams.  These Care Teams include your primary Cardiologist (physician) and Advanced Practice Providers (APPs -  Physician Assistants and Nurse Practitioners) who all work together to provide you with the care you need, when you need it. You will need a follow up appointment in 3 months.  Please call our office 2 months in advance to schedule this appointment.  You may see No primary care provider on file. or another member of our [facility] Provider Team in [place]: Brian Munley, MD . Rajan Revankar, MD  Any Other Special Instructions Will Be Listed Below (If Applicable).   Echocardiogram An echocardiogram is a procedure that uses painless sound waves (ultrasound) to produce an image of the heart.  Images from an echocardiogram can provide important information about:  Signs of coronary artery disease (CAD).  Aneurysm detection. An aneurysm is a weak or damaged part of an artery wall that bulges out from the normal force of blood pumping through the body.  Heart size and shape. Changes in the size or shape of the heart can be associated with certain conditions, including heart failure, aneurysm, and CAD.  Heart muscle function.  Heart valve function.  Signs of a past heart attack.  Fluid buildup around the heart.  Thickening of the heart muscle.  A tumor or infectious growth around the heart valves. Tell a health care provider about:  Any allergies you have.  All medicines you are taking, including vitamins, herbs, eye drops, creams, and over-the-counter medicines.  Any blood disorders you have.  Any surgeries you have had.  Any medical conditions you have.  Whether you are pregnant or may be pregnant. What are the risks? Generally, this is a safe procedure. However, problems may occur, including:  Allergic reaction to dye (contrast) that may be used during the procedure. What happens before the procedure? No specific preparation is needed. You may eat and drink normally. What happens during the procedure?   An IV tube may be inserted into one of your veins.  You may receive contrast through this tube. A contrast is an injection that improves the quality of the pictures from your heart.  A gel will be applied to your chest.  A wand-like tool (transducer) will be moved over your chest. The gel will help to   transmit the sound waves from the transducer.  The sound waves will harmlessly bounce off of your heart to allow the heart images to be captured in real-time motion. The images will be recorded on a computer. The procedure may vary among health care providers and hospitals. What happens after the procedure?  You may return to your normal, everyday life,  including diet, activities, and medicines, unless your health care provider tells you not to do that. Summary  An echocardiogram is a procedure that uses painless sound waves (ultrasound) to produce an image of the heart.  Images from an echocardiogram can provide important information about the size and shape of your heart, heart muscle function, heart valve function, and fluid buildup around your heart.  You do not need to do anything to prepare before this procedure. You may eat and drink normally.  After the echocardiogram is completed, you may return to your normal, everyday life, unless your health care provider tells you not to do that. This information is not intended to replace advice given to you by your health care provider. Make sure you discuss any questions you have with your health care provider. Document Released: 06/12/2000 Document Revised: 07/18/2016 Document Reviewed: 07/18/2016 Elsevier Interactive Patient Education  2019 [employer]    

## 2018-09-14 ENCOUNTER — Other Ambulatory Visit: Payer: Medicare Other

## 2018-09-28 ENCOUNTER — Telehealth: Payer: Self-pay | Admitting: *Deleted

## 2018-09-28 NOTE — Telephone Encounter (Signed)
Cancelled echo, pt knows 

## 2018-10-11 NOTE — Telephone Encounter (Signed)
Recall has been placed for patient's Echo

## 2018-10-13 ENCOUNTER — Other Ambulatory Visit: Payer: Medicare Other

## 2018-10-31 ENCOUNTER — Telehealth: Payer: Self-pay | Admitting: Cardiology

## 2018-10-31 NOTE — Telephone Encounter (Signed)
Called pt to get consent for virtual visit, pt stated to cancel the appt, did not reschedule

## 2018-11-08 ENCOUNTER — Telehealth: Payer: Medicare Other | Admitting: Cardiology

## 2018-11-10 ENCOUNTER — Other Ambulatory Visit: Payer: Self-pay

## 2018-11-10 ENCOUNTER — Ambulatory Visit (INDEPENDENT_AMBULATORY_CARE_PROVIDER_SITE_OTHER): Payer: Medicare Other

## 2018-11-10 DIAGNOSIS — I2510 Atherosclerotic heart disease of native coronary artery without angina pectoris: Secondary | ICD-10-CM

## 2018-11-10 NOTE — Progress Notes (Signed)
Complete echocardiogram has been performed.  Jimmy Jayliani Wanner RDCS, RVT 

## 2019-06-15 DIAGNOSIS — S32000D Wedge compression fracture of unspecified lumbar vertebra, subsequent encounter for fracture with routine healing: Secondary | ICD-10-CM | POA: Insufficient documentation

## 2019-06-15 HISTORY — DX: Wedge compression fracture of unspecified lumbar vertebra, subsequent encounter for fracture with routine healing: S32.000D

## 2019-08-02 DIAGNOSIS — M47816 Spondylosis without myelopathy or radiculopathy, lumbar region: Secondary | ICD-10-CM

## 2019-08-02 HISTORY — DX: Spondylosis without myelopathy or radiculopathy, lumbar region: M47.816

## 2019-09-20 ENCOUNTER — Other Ambulatory Visit: Payer: Self-pay

## 2019-09-20 ENCOUNTER — Ambulatory Visit (INDEPENDENT_AMBULATORY_CARE_PROVIDER_SITE_OTHER): Payer: Medicare Other | Admitting: Cardiology

## 2019-09-20 ENCOUNTER — Encounter: Payer: Self-pay | Admitting: Cardiology

## 2019-09-20 VITALS — BP 134/74 | HR 113 | Ht 63.0 in | Wt 200.4 lb

## 2019-09-20 DIAGNOSIS — I714 Abdominal aortic aneurysm, without rupture, unspecified: Secondary | ICD-10-CM

## 2019-09-20 DIAGNOSIS — E785 Hyperlipidemia, unspecified: Secondary | ICD-10-CM | POA: Diagnosis not present

## 2019-09-20 DIAGNOSIS — I10 Essential (primary) hypertension: Secondary | ICD-10-CM | POA: Diagnosis not present

## 2019-09-20 DIAGNOSIS — I2510 Atherosclerotic heart disease of native coronary artery without angina pectoris: Secondary | ICD-10-CM | POA: Diagnosis not present

## 2019-09-20 NOTE — Patient Instructions (Addendum)
Medication Instructions:  Your physician recommends that you continue on your current medications as directed. Please refer to the Current Medication list given to you today.  *If you need a refill on your cardiac medications before your next appointment, please call your pharmacy*   Lab Work: None.  If you have labs (blood work) drawn today and your tests are completely normal, you will receive your results only by: Marland Kitchen MyChart Message (if you have MyChart) OR . A paper copy in the mail If you have any lab test that is abnormal or we need to change your treatment, we will call you to review the results.   Testing/Procedures: Your physician has requested that you have an echocardiogram. Echocardiography is a painless test that uses sound waves to create images of your heart. It provides your doctor with information about the size and shape of your heart and how well your heart's chambers and valves are working. This procedure takes approximately one hour. There are no restrictions for this procedure.       Please arrive 15 minutes prior to your appointment time for registration and insurance purposes.  The test will take approximately 3 to 4 hours to complete; you may bring reading material.  If someone comes with you to your appointment, they will need to remain in the main lobby due to limited space in the testing area. **If you are pregnant or breastfeeding, please notify the nuclear lab prior to your appointment**  How to prepare for your Myocardial Perfusion Test: . Do not eat or drink 3 hours prior to your test, except you may have water. . Do not consume products containing caffeine (regular or decaffeinated) 12 hours prior to your test. (ex: coffee, chocolate, sodas, tea). . Do bring a list of your current medications with you.  If not listed below, you may take your medications as normal. . , HOLD diabetic medication/insulin the morning of the test: trulicity, insulin novolog,  lantus, humalog, metformin, .  Take half of long acting insulin the night before test: Lantus 45 units the night before . Do wear comfortable clothes (no dresses or overalls) and walking shoes, tennis shoes preferred (No heels or open toe shoes are allowed). . Do NOT wear cologne, perfume, aftershave, or lotions (deodorant is allowed). . If these instructions are not followed, your test will have to be rescheduled.   If you cannot keep your appointment, please provide 24 hours notification to the Nuclear Lab, to avoid a possible $50 charge to your account.    Follow-Up: At [facility], you and your health needs are our priority.  As part of our continuing mission to provide you with exceptional heart care, we have created designated Provider Care Teams.  These Care Teams include your primary Cardiologist (physician) and Advanced Practice Providers (APPs -  Physician Assistants and Nurse Practitioners) who all work together to provide you with the care you need, when you need it.  We recommend signing up for the patient portal called "MyChart".  Sign up information is provided on this After Visit Summary.  MyChart is used to connect with patients for Virtual Visits (Telemedicine).  Patients are able to view lab/test results, encounter notes, upcoming appointments, etc.  Non-urgent messages can be sent to your provider as well.   To learn more about what you can do with MyChart, go to [email or web address].    Your next appointment:   3 month(s)  The format for your next appointment:   In Person  Provider:   Gypsy Balsam, MD   Other Instructions   Echocardiogram An echocardiogram is a procedure that uses painless sound waves (ultrasound) to produce an image of the heart. Images from an echocardiogram can provide important information about:  Signs of coronary artery disease (CAD).  Aneurysm detection. An aneurysm is a weak or damaged part of an artery wall that bulges out  from the normal force of blood pumping through the body.  Heart size and shape. Changes in the size or shape of the heart can be associated with certain conditions, including heart failure, aneurysm, and CAD.  Heart muscle function.  Heart valve function.  Signs of a past heart attack.  Fluid buildup around the heart.  Thickening of the heart muscle.  A tumor or infectious growth around the heart valves. Tell a health care provider about:  Any allergies you have.  All medicines you are taking, including vitamins, herbs, eye drops, creams, and over-the-counter medicines.  Any blood disorders you have.  Any surgeries you have had.  Any medical conditions you have.  Whether you are pregnant or may be pregnant. What are the risks? Generally, this is a safe procedure. However, problems may occur, including:  Allergic reaction to dye (contrast) that may be used during the procedure. What happens before the procedure? No specific preparation is needed. You may eat and drink normally. What happens during the procedure?   An IV tube may be inserted into one of your veins.  You may receive contrast through this tube. A contrast is an injection that improves the quality of the pictures from your heart.  A gel will be applied to your chest.  A wand-like tool (transducer) will be moved over your chest. The gel will help to transmit the sound waves from the transducer.  The sound waves will harmlessly bounce off of your heart to allow the heart images to be captured in real-time motion. The images will be recorded on a computer. The procedure may vary among health care providers and hospitals. What happens after the procedure?  You may return to your normal, everyday life, including diet, activities, and medicines, unless your health care provider tells you not to do that. Summary  An echocardiogram is a procedure that uses painless sound waves (ultrasound) to produce an image  of the heart.  Images from an echocardiogram can provide important information about the size and shape of your heart, heart muscle function, heart valve function, and fluid buildup around your heart.  You do not need to do anything to prepare before this procedure. You may eat and drink normally.  After the echocardiogram is completed, you may return to your normal, everyday life, unless your health care provider tells you not to do that. This information is not intended to replace advice given to you by your health care provider. Make sure you discuss any questions you have with your health care provider. Document Revised: 10/06/2018 Document Reviewed: 07/18/2016 Elsevier Patient Education  2020 [employer]   Cardiac Nuclear Scan A cardiac nuclear scan is a test that measures blood flow to the heart when a person is resting and when he or she is exercising. The test looks for problems such as:  Not enough blood reaching a portion of the heart.  The heart muscle not working normally. You may need this test if:  You have heart disease.  You have had abnormal lab results.  You have had heart surgery or a balloon procedure to open  up blocked arteries (angioplasty).  You have chest pain.  You have shortness of breath. In this test, a radioactive dye (tracer) is injected into your bloodstream. After the tracer has traveled to your heart, an imaging device is used to measure how much of the tracer is absorbed by or distributed to various areas of your heart. This procedure is usually done at a hospital and takes 2-4 hours. Tell a health care provider about:  Any allergies you have.  All medicines you are taking, including vitamins, herbs, eye drops, creams, and over-the-counter medicines.  Any problems you or family members have had with anesthetic medicines.  Any blood disorders you have.  Any surgeries you have had.  Any medical conditions you have.  Whether you are  pregnant or may be pregnant. What are the risks? Generally, this is a safe procedure. However, problems may occur, including:  Serious chest pain and heart attack. This is only a risk if the stress portion of the test is done.  Rapid heartbeat.  Sensation of warmth in your chest. This usually passes quickly.  Allergic reaction to the tracer. What happens before the procedure?  Ask your health care provider about changing or stopping your regular medicines. This is especially important if you are taking diabetes medicines or blood thinners.  Follow instructions from your health care provider about eating or drinking restrictions.  Remove your jewelry on the day of the procedure. What happens during the procedure?  An IV will be inserted into one of your veins.  Your health care provider will inject a small amount of radioactive tracer through the IV.  You will wait for 20-40 minutes while the tracer travels through your bloodstream.  Your heart activity will be monitored with an electrocardiogram (ECG).  You will lie down on an exam table.  Images of your heart will be taken for about 15-20 minutes.  You may also have a stress test. For this test, one of the following may be done: ? You will exercise on a treadmill or stationary bike. While you exercise, your heart's activity will be monitored with an ECG, and your blood pressure will be checked. ? You will be given medicines that will increase blood flow to parts of your heart. This is done if you are unable to exercise.  When blood flow to your heart has peaked, a tracer will again be injected through the IV.  After 20-40 minutes, you will get back on the exam table and have more images taken of your heart.  Depending on the type of tracer used, scans may need to be repeated 3-4 hours later.  Your IV line will be removed when the procedure is over. The procedure may vary among health care providers and hospitals. What  happens after the procedure?  Unless your health care provider tells you otherwise, you may return to your normal schedule, including diet, activities, and medicines.  Unless your health care provider tells you otherwise, you may increase your fluid intake. This will help to flush the contrast dye from your body. Drink enough fluid to keep your urine pale yellow.  Ask your health care provider, or the department that is doing the test: ? When will my results be ready? ? How will I get my results? Summary  A cardiac nuclear scan measures the blood flow to the heart when a person is resting and when he or she is exercising.  Tell your health care provider if you are pregnant.  Before  the procedure, ask your health care provider about changing or stopping your regular medicines. This is especially important if you are taking diabetes medicines or blood thinners.  After the procedure, unless your health care provider tells you otherwise, increase your fluid intake. This will help flush the contrast dye from your body.  After the procedure, unless your health care provider tells you otherwise, you may return to your normal schedule, including diet, activities, and medicines. This information is not intended to replace advice given to you by your health care provider. Make sure you discuss any questions you have with your health care provider. Document Revised: 11/29/2017 Document Reviewed: 11/29/2017 Elsevier Patient Education  [place].

## 2019-09-20 NOTE — Progress Notes (Signed)
Cardiology Office Note:    Date:  09/20/2019   ID:  Deanna Dunn, DOB Sep 10, 1955, MRN [id number]  PCP:  Gordan Payment., MD  Cardiologist:  Gypsy Balsam, MD    Referring MD: Gordan Payment., MD   No chief complaint on file. I need surgery my shoulder  History of Present Illness:    Deanna Dunn is a 64 y.o. female with past medical history significant for coronary artery disease, in 2008 she did have PTCA and stenting done at [facility], also history of diabetes which is poorly controlled, dyslipidemia, essential hypertension.  She comes today to my office because she sustained a fall and have injury to her left shoulder.  Surgery of her left lateral calf is contemplated.  She denies have any cardiac complaint but her ability to exercise is limited because of chronic back problem.  She did receive some steroid injections there and she is feeling much better.  Past Medical History:  Diagnosis Date  . Abdominal aortic aneurysm without rupture (HCC) 08/02/2015   Last Assessment & Plan:  Relevant Hx: Course: Daily Update: Today's Plan:this has been followed through cardio and she again is advised that she needs to stop smoking  Electronically signed by: Krystal Clark, NP 08/05/15 1019  . Asthma 08/02/2015   Last Assessment & Plan:  Relevant Hx: Course: Daily Update: Today's Plan:this is stable for her at this time  Electronically signed by: Krystal Clark, NP 08/05/15 1021  . CHEST PAIN-UNSPECIFIED 09/18/2009   Qualifier: Diagnosis of  By: Jolene Provost   . COPD (chronic obstructive pulmonary disease) with chronic bronchitis (HCC) 08/02/2015   Last Assessment & Plan:  Relevant Hx: Course: Daily Update: Today's Plan:stable for her as can be with her continued smoking not using any inhalers at this time  Electronically signed by: Krystal Clark, NP 08/05/15 1020  . CORONARY ARTERY DISEASE 03/28/2007   Qualifier: Diagnosis of  By: Charlsie Quest RMA,  Lucy    . DDD (degenerative disc disease), lumbosacral 08/02/2015   Last Assessment & Plan:  Relevant Hx: Course: Daily Update: Today's Plan:chronic for her with pain management through in town provider   Electronically signed by: Krystal Clark, NP 08/05/15 1023  . DIABETES MELLITUS, TYPE I 03/28/2007   Qualifier: Diagnosis of  By: Charlsie Quest RMA, Lucy    . Diabetic autonomic neuropathy (HCC) 08/02/2015  . Diastolic heart failure    mild  . Dysfunction of both eustachian tubes 08/27/2017  . Dyslipidemia 11/30/2017  . 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 Sandria Manly for mental health in [place] she states and her abilify was increased to 10mg  though she does not know when  Electronically signed by: , NP 08/05/15 1025  . Eczema of both external ears 05/11/2017  . Fatigue 08/05/2015   Last Assessment & Plan:  Relevant Hx: Course: Daily Update: Today's Plan:chronic to some degree for her and tied to her multipharmacy and we discussed her smoking, her inactivity and she is aware of all of this  Electronically signed by: 10/03/2015, NP 08/05/15 1026  . GERD 10/31/2009   Qualifier: Diagnosis of  By: 12/31/2009, MD, Excell Seltzer   . High risk medication use 02/01/2017  . Hypercalcemia 08/02/2015  . HYPERLIPIDEMIA 03/28/2007   Qualifier: Diagnosis of  By: 03/30/2007, Lucy    . HYPERTENSION 03/28/2007   Qualifier: Diagnosis of  By: 03/30/2007, Lucy    .  HYPOTHYROIDISM 03/28/2007   Qualifier: Diagnosis of  By: Tyrone Apple, Lucy    . Long term (current) use of insulin (HCC) 08/02/2015   Last Assessment & Plan:  Relevant Hx: Course: Daily Update: Today's Plan:follows through endocrine  Electronically signed by: Krystal Clark, NP 08/05/15 1025  . Major depressive disorder, recurrent, mild (HCC) 08/01/2015  . Obesity   . Obesity (BMI 30-39.9) 10/03/2015  . Obstructive sleep apnea   . OSA on  CPAP 08/02/2015   Last Assessment & Plan:  Relevant Hx: Course: Daily Update: Today's Plan:she is wearing her bipap/cpap and follows with Dr.Chodri for this not sure of her settings but she is compliant with use nightly  Electronically signed by: Krystal Clark, NP 08/05/15 1020  . Osteoarthritis, generalized 08/02/2015   Last Assessment & Plan:  Relevant Hx: Course: Daily Update: Today's Plan:weight complicates her picture for her and she is having difficulty with getting around but using her walker for support   Electronically signed by: Krystal Clark, NP 08/05/15 1024  . OVERWEIGHT/OBESITY 09/18/2009   Qualifier: Diagnosis of  By: Jolene Provost   . Restless leg syndrome 02/01/2017  . TOBACCO ABUSE 10/31/2009   Qualifier: Diagnosis of  By: Excell Seltzer, MD, [place]   . Tobacco user   . Urinary incontinence 11/20/2016  . Vitamin B12 deficiency 02/03/2017  . Vitamin D deficiency 08/02/2015   Last Assessment & Plan:  Relevant Hx: Course: Daily Update: Today's Plan:she is taking this weekly  Electronically signed by: Krystal Clark, NP 08/05/15 1025    Past Surgical History:  Procedure Laterality Date  . CARDIAC CATHETERIZATION  08/2009   instent restenosis and thrombosis in RCA, 70% mid LAD stenosis  . CHOLECYSTECTOMY    . CORONARY ANGIOPLASTY WITH STENT PLACEMENT  08/2009   RCA: 3.5 X 23 mm Promus DES.  Marland Kitchen ENDOSCOPIC HEMILAMINOTOMY W/ DISCECTOMY LUMBAR    . HEMIARTHROPLASTY HIP Right 07/09/2015   Displaced intertrochanteric fx of rt femur with progression to the hemiarthroplasty  . TUBAL LIGATION      Current Medications: Current Meds  Medication Sig  . Acetaminophen (TYLENOL ARTHRITIS PAIN PO) Take 650 mg by mouth at bedtime. To take 2 tablets every night at bedtime.   Marland Kitchen ADVAIR HFA 115-21 MCG/ACT inhaler Inhale 2 puffs into the lungs daily.  . Albuterol Sulfate (PROAIR HFA IN) Inhale 2 puffs into the lungs every 4 (four) hours as needed.    Marland Kitchen  alendronate (FOSAMAX) 70 MG tablet Take 70 mg by mouth once a week.  . ARIPiprazole (ABILIFY) 5 MG tablet Take 2 a day  . aspirin 81 MG tablet Take 81 mg by mouth daily.    Marland Kitchen atorvastatin (LIPITOR) 80 MG tablet Take 80 mg by mouth daily.    . Blood Glucose Monitoring Suppl (GLUCOCOM BLOOD GLUCOSE MONITOR) DEVI by Misc.(Non-Drug; Combo Route) route.  Marland Kitchen buPROPion (WELLBUTRIN SR) 150 MG 12 hr tablet Take 150 mg by mouth 2 (two) times daily.    . calcium carbonate (OS-CAL) 600 MG TABS Take 600 mg by mouth 2 (two) times daily with a meal.    . cholecalciferol (VITAMIN D) 1000 UNITS tablet Take 1,000 Units by mouth 2 (two) times daily.    . cloNIDine (CATAPRES) 0.2 MG tablet Take  1 tab twice a day (dose increased)  . Continuous Blood Gluc Receiver (FREESTYLE LIBRE 14 DAY READER) DEVI 1 Units by Misc.(Non-Drug; Combo Route) route as needed.  . Dulaglutide 1.5 MG/0.5ML SOPN Inject into the skin.  Marland Kitchen  DULoxetine (CYMBALTA) 60 MG capsule Take 60 mg by mouth 2 (two) times daily.    . fenofibrate 160 MG tablet Take 160 mg by mouth daily.    . fluticasone (FLONASE) 50 MCG/ACT nasal spray Place 2 sprays into both nostrils daily.  . folic acid (FOLVITE) 1 MG tablet Take 1 mg by mouth daily.    . furosemide (LASIX) 40 MG tablet Take 40 mg by mouth 2 (two) times daily.   Marland Kitchen glucose blood (FREESTYLE INSULINX TEST) test strip Check BG 4 times/day.  Dx E11.65  . insulin aspart (NOVOLOG FLEXPEN) 100 UNIT/ML FlexPen USE AS DIRECTED BY YOUR PRESCRIBER  . Insulin Glargine (LANTUS SOLOSTAR) 100 UNIT/ML Solostar Pen Inject 80 Units into the skin 2 (two) times daily.   . insulin lispro (HUMALOG) 100 UNIT/ML injection Inject 22 Units into the skin 3 (three) times daily before meals.   . isosorbide mononitrate (IMDUR) 30 MG 24 hr tablet Take 1 tablet (30 mg total) by mouth daily.  Marland Kitchen levothyroxine (LEVOXYL) 200 MCG tablet TAKE 1 TABLET DAILY AT 0600  . magnesium oxide (MAG-OX) 400 MG tablet Take by mouth.  . metFORMIN  (GLUCOPHAGE-XR) 500 MG 24 hr tablet Take 500 mg by mouth 2 (two) times daily.   . metoprolol succinate (TOPROL-XL) 100 MG 24 hr tablet Take by mouth.  . nitroGLYCERIN (NITROSTAT) 0.4 MG SL tablet Place 0.4 mg under the tongue every 5 (five) minutes as needed.    Marland Kitchen omeprazole (PRILOSEC) 20 MG capsule Take 40 mg by mouth 2 (two) times daily.    . potassium chloride SA (K-DUR,KLOR-CON) 20 MEQ tablet Take by mouth.  . pregabalin (LYRICA) 225 MG capsule Take 225 mg by mouth 2 (two) times daily.   Marland Kitchen spironolactone-hydrochlorothiazide (ALDACTAZIDE) 25-25 MG tablet TAKE 1 TABLET DAILY  . triamcinolone (KENALOG) 0.5 % cream Apply topically as needed.    . Vitamin D, Ergocalciferol, (DRISDOL) 50000 units CAPS capsule TAKE 1 CAPSULE EVERY 7 DAYS     Allergies:   Sulfamethoxazole, Actos [pioglitazone hydrochloride], Amlodipine, Amoxicillin, Ampicillin, Codeine, Fentanyl, Pioglitazone, and Sulfonamide derivatives   Social History   Socioeconomic History  . Marital status: Married    Spouse name: Not on file  . Number of children: Not on file  . Years of education: Not on file  . Highest education level: Not on file  Occupational History  . Not on file  Tobacco Use  . Smoking status: Former Smoker    Types: Cigarettes  . Smokeless tobacco: Never Used  Substance and Sexual Activity  . Alcohol use: Not Currently  . Drug use: Never  . Sexual activity: Not on file  Other Topics Concern  . Not on file  Social History Narrative  . Not on file   Social Determinants of Health   Financial Resource Strain:   . Difficulty of Paying Living Expenses:   Food Insecurity:   . Worried About Charity fundraiser in the Last Year:   . Arboriculturist in the Last Year:   Transportation Needs:   . Film/video editor (Medical):   Marland Kitchen Lack of Transportation (Non-Medical):   Physical Activity:   . Days of Exercise per Week:   . Minutes of Exercise per Session:   Stress:   . Feeling of Stress :   Social  Connections:   . Frequency of Communication with Friends and Family:   . Frequency of Social Gatherings with Friends and Family:   . Attends Religious Services:   .  Active Member of Clubs or Organizations:   . Attends Banker Meetings:   Marland Kitchen Marital Status:      Family History: The patient's family history includes Diabetes in her mother; Heart attack in her father and mother; Thyroid disease in her mother. ROS:   Please see the history of present illness.    All 14 point review of systems negative except as described per history of present illness  EKGs/Labs/Other Studies Reviewed:      Recent Labs: No results found for requested labs within last 8760 hours.  Recent Lipid Panel    Component Value Date/Time   CHOL 201 (H) 03/10/2018 0937   TRIG 301 (H) 03/10/2018 0937   HDL 38 (L) 03/10/2018 0937   CHOLHDL 5.3 (H) 03/10/2018 0937   CHOLHDL 5.9 09/03/2009 0357   VLDL UNABLE TO CALCULATE IF TRIGLYCERIDE OVER 400 mg/dL 93/26/7124 5809   LDLCALC 103 (H) 03/10/2018 0937    Physical Exam:    VS:  BP 134/74   Pulse (!) 113   Ht 5\' 3"  (1.6 m)   Wt 200 lb 6.4 oz (90.9 kg)   SpO2 90%   BMI 35.50 kg/m     Wt Readings from Last 3 Encounters:  09/20/19 200 lb 6.4 oz (90.9 kg)  08/10/18 213 lb 6.4 oz (96.8 kg)  03/10/18 236 lb 12.8 oz (107.4 kg)     GEN:  Well nourished, well developed in no acute distress HEENT: Normal NECK: No JVD; No carotid bruits LYMPHATICS: No lymphadenopathy CARDIAC: RRR, no murmurs, no rubs, no gallops RESPIRATORY:  Clear to auscultation without rales, wheezing or rhonchi  ABDOMEN: Soft, non-tender, non-distended MUSCULOSKELETAL:  No edema; No deformity  SKIN: Warm and dry LOWER EXTREMITIES: no swelling NEUROLOGIC:  Alert and oriented x 3 PSYCHIATRIC:  Normal affect   ASSESSMENT:    1. Coronary artery disease involving native coronary artery of native heart without angina pectoris   2. Abdominal aortic aneurysm without rupture  (HCC)   3. Essential hypertension   4. Dyslipidemia    PLAN:    In order of problems listed above:  1. Coronary artery disease status post PTCA and stenting many years ago.  Multiple risk factors also inability to exercise and we talking about doing surgery.  I think it would be reasonable to perform stress test on her.  I will schedule her to have Lexiscan.  As a part of evaluation she will have echocardiogram done.  Her EKG showed sinus rhythm, potential old inferior wall MI as well as anterior septal wall MI.  That need to be clarified.  I will not alter any of her medication today. 2. Abdominal troponin was less than measuring only 3.5 cm.  We will continue monitoring and next year will repeat ultrasounds of her abdomen. 3. Essential hypertension blood pressure appears to be controlled we will continue present management. 4. Dyslipidemia she is on high intensity statin,, I did receive fasting lipid profile from primary care physician which show LDL of 41 and HDL of 44.   Medication Adjustments/Labs and Tests Ordered: Current medicines are reviewed at length with the patient today.  Concerns regarding medicines are outlined above.  No orders of the defined types were placed in this encounter.  Medication changes: No orders of the defined types were placed in this encounter.   Signed, 05/10/18, MD, [facility] 09/20/2019 4:22 PM    [facility]

## 2019-10-04 ENCOUNTER — Telehealth: Payer: Self-pay | Admitting: Cardiology

## 2019-10-04 ENCOUNTER — Telehealth (HOSPITAL_COMMUNITY): Payer: Self-pay | Admitting: *Deleted

## 2019-10-04 NOTE — Telephone Encounter (Signed)
Called the pt to provide further assistance and she stated that a nurse already called her back, and moved her testing dates up to a sooner time frame.  Pt states she does not require any further assistance at this time, but was gracious for the call back.

## 2019-10-04 NOTE — Telephone Encounter (Signed)
Deanna Dunn is calling wanting to reschedule her upcoming test for sooner dates. She states she does not want them that far apart. Please advise.

## 2019-10-04 NOTE — Telephone Encounter (Signed)
Patient given detailed instructions per Myocardial Perfusion Study Information Sheet for the test on 10/11/19. Patient notified to arrive 15 minutes early and that it is imperative to arrive on time for appointment to keep from having the test rescheduled.  If you need to cancel or reschedule your appointment, please call the office within 24 hours of your appointment. . Patient verbalized understanding. Ziv Welchel Jacqueline    

## 2019-10-11 ENCOUNTER — Other Ambulatory Visit: Payer: Self-pay

## 2019-10-11 ENCOUNTER — Ambulatory Visit (INDEPENDENT_AMBULATORY_CARE_PROVIDER_SITE_OTHER): Payer: Medicare Other

## 2019-10-11 VITALS — Ht 63.0 in | Wt 200.0 lb

## 2019-10-11 DIAGNOSIS — I10 Essential (primary) hypertension: Secondary | ICD-10-CM | POA: Diagnosis not present

## 2019-10-11 DIAGNOSIS — I2510 Atherosclerotic heart disease of native coronary artery without angina pectoris: Secondary | ICD-10-CM | POA: Diagnosis not present

## 2019-10-11 LAB — MYOCARDIAL PERFUSION IMAGING
LV dias vol: 87 mL (ref 46–106)
LV sys vol: 35 mL
Peak HR: 96 {beats}/min
Rest HR: 87 {beats}/min
SDS: 11
SRS: 2
SSS: 12
TID: 1.09

## 2019-10-11 MED ORDER — REGADENOSON 0.4 MG/5ML IV SOLN
0.4000 mg | Freq: Once | INTRAVENOUS | Status: AC
  Administered 2019-10-11: 0.4 mg via INTRAVENOUS

## 2019-10-11 MED ORDER — TECHNETIUM TC 99M TETROFOSMIN IV KIT
9.5000 | PACK | Freq: Once | INTRAVENOUS | Status: AC | PRN
  Administered 2019-10-11: 9.5 via INTRAVENOUS

## 2019-10-11 MED ORDER — TECHNETIUM TC 99M TETROFOSMIN IV KIT
29.4000 | PACK | Freq: Once | INTRAVENOUS | Status: AC | PRN
  Administered 2019-10-11: 29.4 via INTRAVENOUS

## 2019-10-12 ENCOUNTER — Ambulatory Visit (INDEPENDENT_AMBULATORY_CARE_PROVIDER_SITE_OTHER): Payer: Medicare Other

## 2019-10-12 DIAGNOSIS — I10 Essential (primary) hypertension: Secondary | ICD-10-CM

## 2019-10-12 DIAGNOSIS — I2510 Atherosclerotic heart disease of native coronary artery without angina pectoris: Secondary | ICD-10-CM | POA: Diagnosis not present

## 2019-10-17 ENCOUNTER — Encounter: Payer: Self-pay | Admitting: Cardiology

## 2019-10-17 ENCOUNTER — Other Ambulatory Visit: Payer: Self-pay

## 2019-10-17 ENCOUNTER — Ambulatory Visit (INDEPENDENT_AMBULATORY_CARE_PROVIDER_SITE_OTHER): Payer: Medicare Other | Admitting: Cardiology

## 2019-10-17 VITALS — BP 146/66 | HR 80 | Ht 63.0 in | Wt 205.0 lb

## 2019-10-17 DIAGNOSIS — I2510 Atherosclerotic heart disease of native coronary artery without angina pectoris: Secondary | ICD-10-CM | POA: Diagnosis not present

## 2019-10-17 DIAGNOSIS — E785 Hyperlipidemia, unspecified: Secondary | ICD-10-CM

## 2019-10-17 DIAGNOSIS — I10 Essential (primary) hypertension: Secondary | ICD-10-CM

## 2019-10-17 DIAGNOSIS — I714 Abdominal aortic aneurysm, without rupture, unspecified: Secondary | ICD-10-CM

## 2019-10-17 NOTE — Patient Instructions (Signed)
Medication Instructions:  Your physician recommends that you continue on your current medications as directed. Please refer to the Current Medication list given to you today.  *If you need a refill on your cardiac medications before your next appointment, please call your pharmacy*  Lab Work: None ordered.  If you have labs (blood work) drawn today and your tests are completely normal, you will receive your results only by: . MyChart Message (if you have MyChart) OR . A paper copy in the mail If you have any lab test that is abnormal or we need to change your treatment, we will call you to review the results.   Testing/Procedures: None ordered.    Follow-Up: At [facility], you and your health needs are our priority.  As part of our continuing mission to provide you with exceptional heart care, we have created designated Provider Care Teams.  These Care Teams include your primary Cardiologist (physician) and Advanced Practice Providers (APPs -  Physician Assistants and Nurse Practitioners) who all work together to provide you with the care you need, when you need it.  We recommend signing up for the patient portal called "MyChart".  Sign up information is provided on this After Visit Summary.  MyChart is used to connect with patients for Virtual Visits (Telemedicine).  Patients are able to view lab/test results, encounter notes, upcoming appointments, etc.  Non-urgent messages can be sent to your provider as well.   To learn more about what you can do with MyChart, go to [email or web address].    Your next appointment:   3 month(s)  The format for your next appointment:   In Person  Provider:   Robert Krasowski, MD    

## 2019-10-17 NOTE — Progress Notes (Signed)
Cardiology Office Note:    Date:  10/17/2019   ID:  Deanna Dunn, DOB [birth year], MRN [id number]  PCP:  Gordan Payment., MD  Cardiologist:  Gypsy Balsam, MD    Referring MD: Gordan Payment., MD   Chief Complaint  Patient presents with  . Follow-up  I am here to discuss my tests  History of Present Illness:    Deanna Dunn is a 64 y.o. female past medical history significant for coronary artery disease, in 2008 she did have PTCA and stenting done at [facility], also history of diabetes which is poorly controlled, dyslipidemia, essential hypertension.  Last cardiac catheterization done in December 2016 which showed completely occluded right coronary artery with collateralization.  She is scheduled to have shoulder surgery under general anesthesia and that is why I am seeing her for evaluation before the surgery.  We did stress test stress test showed small area of ischemia involving inferior wall which is in correlation with her right coronary which is completely occluded.  Her echocardiogram showed preserved left ventricle ejection fraction.  Overall she denies have any chest pain tightness squeezing pressure burning chest but her mobility is very limited.  Past Medical History:  Diagnosis Date  . Abdominal aortic aneurysm without rupture (HCC) 08/02/2015   Last Assessment & Plan:  Relevant Hx: Course: Daily Update: Today's Plan:this has been followed through cardio and she again is advised that she needs to stop smoking  Electronically signed by: Krystal Clark, NP 08/05/15 1019  . Asthma 08/02/2015   Last Assessment & Plan:  Relevant Hx: Course: Daily Update: Today's Plan:this is stable for her at this time  Electronically signed by: Krystal Clark, NP 08/05/15 1021  . CHEST PAIN-UNSPECIFIED 09/18/2009   Qualifier: Diagnosis of  By: Jolene Provost   . COPD (chronic obstructive pulmonary disease) with chronic bronchitis (HCC) 08/02/2015   Last  Assessment & Plan:  Relevant Hx: Course: Daily Update: Today's Plan:stable for her as can be with her continued smoking not using any inhalers at this time  Electronically signed by: Krystal Clark, NP 08/05/15 1020  . CORONARY ARTERY DISEASE 03/28/2007   Qualifier: Diagnosis of  By: Charlsie Quest RMA, Lucy    . DDD (degenerative disc disease), lumbosacral 08/02/2015   Last Assessment & Plan:  Relevant Hx: Course: Daily Update: Today's Plan:chronic for her with pain management through in town provider   Electronically signed by: Krystal Clark, NP 08/05/15 1023  . DIABETES MELLITUS, TYPE I 03/28/2007   Qualifier: Diagnosis of  By: Charlsie Quest RMA, Lucy    . Diabetic autonomic neuropathy (HCC) 08/02/2015  . Diastolic heart failure    mild  . Dysfunction of both eustachian tubes 08/27/2017  . Dyslipidemia 11/30/2017  . 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 Sandria Manly for mental health in [place] she states and her abilify was increased to 10mg  though she does not know when  Electronically signed by: , NP 08/05/15 1025  . Eczema of both external ears 05/11/2017  . Fatigue 08/05/2015   Last Assessment & Plan:  Relevant Hx: Course: Daily Update: Today's Plan:chronic to some degree for her and tied to her multipharmacy and we discussed her smoking, her inactivity and she is aware of all of this  Electronically signed by: 10/03/2015, NP 08/05/15 1026  . GERD 10/31/2009   Qualifier: Diagnosis of  By: 12/31/2009, MD, Excell Seltzer   .  High risk medication use 02/01/2017  . Hypercalcemia 08/02/2015  . HYPERLIPIDEMIA 03/28/2007   Qualifier: Diagnosis of  By: Reatha Armour, Lucy    . HYPERTENSION 03/28/2007   Qualifier: Diagnosis of  By: Reatha Armour, Lucy    . HYPOTHYROIDISM 03/28/2007   Qualifier: Diagnosis of  By: Reatha Armour, Lucy    . Long term (current) use of insulin ([place]) 08/02/2015   Last  Assessment & Plan:  Relevant Hx: Course: Daily Update: Today's Plan:follows through endocrine  Electronically signed by: Mayer Camel, NP 08/05/15 1025  . Major depressive disorder, recurrent, mild ([place]) 08/01/2015  . Obesity   . Obesity (BMI 30-39.9) 10/03/2015  . Obstructive sleep apnea   . OSA on CPAP 08/02/2015   Last Assessment & Plan:  Relevant Hx: Course: Daily Update: Today's Plan:she is wearing her bipap/cpap and follows with Dr.Chodri for this not sure of her settings but she is compliant with use nightly  Electronically signed by: Mayer Camel, NP 08/05/15 1020  . Osteoarthritis, generalized 08/02/2015   Last Assessment & Plan:  Relevant Hx: Course: Daily Update: Today's Plan:weight complicates her picture for her and she is having difficulty with getting around but using her walker for support   Electronically signed by: Mayer Camel, NP 08/05/15 1024  . OVERWEIGHT/OBESITY 09/18/2009   Qualifier: Diagnosis of  By: Arvid Right   . Restless leg syndrome 02/01/2017  . TOBACCO ABUSE 10/31/2009   Qualifier: Diagnosis of  By: Burt Knack, MD, Clayburn Pert   . Tobacco user   . Urinary incontinence 11/20/2016  . Vitamin B12 deficiency 02/03/2017  . Vitamin D deficiency 08/02/2015   Last Assessment & Plan:  Relevant Hx: Course: Daily Update: Today's Plan:she is taking this weekly  Electronically signed by: Mayer Camel, NP 08/05/15 1025    Past Surgical History:  Procedure Laterality Date  . CARDIAC CATHETERIZATION  08/2009   instent restenosis and thrombosis in RCA, 70% mid LAD stenosis  . CHOLECYSTECTOMY    . CORONARY ANGIOPLASTY WITH STENT PLACEMENT  08/2009   RCA: 3.5 X 23 mm Promus DES.  Marland Kitchen ENDOSCOPIC HEMILAMINOTOMY W/ DISCECTOMY LUMBAR    . HEMIARTHROPLASTY HIP Right 07/09/2015   Displaced intertrochanteric fx of rt femur with progression to the hemiarthroplasty  . TUBAL LIGATION      Current Medications: Current Meds    Medication Sig  . Acetaminophen (TYLENOL ARTHRITIS PAIN PO) Take 650 mg by mouth at bedtime. To take 2 tablets every night at bedtime.   Marland Kitchen ADVAIR HFA 115-21 MCG/ACT inhaler Inhale 2 puffs into the lungs daily.  . Albuterol Sulfate (PROAIR HFA IN) Inhale 2 puffs into the lungs every 4 (four) hours as needed.    Marland Kitchen alendronate (FOSAMAX) 70 MG tablet Take 70 mg by mouth once a week.  Marland Kitchen aspirin 81 MG tablet Take 81 mg by mouth daily.    Marland Kitchen atorvastatin (LIPITOR) 80 MG tablet Take 80 mg by mouth daily.    . Blood Glucose Monitoring Suppl (GLUCOCOM BLOOD GLUCOSE MONITOR) DEVI by [place].(Non-Drug; Combo Route) route.  Marland Kitchen buPROPion (WELLBUTRIN SR) 150 MG 12 hr tablet Take 150 mg by mouth 2 (two) times daily.    . calcium carbonate (OS-CAL) 600 MG TABS Take 600 mg by mouth 2 (two) times daily with a meal.    . cholecalciferol (VITAMIN D) 1000 UNITS tablet Take 1,000 Units by mouth 2 (two) times daily.    . cloNIDine (CATAPRES) 0.2 MG tablet Take  1 tab twice a day (  dose increased)  . Continuous Blood Gluc Receiver (FREESTYLE LIBRE 14 DAY READER) DEVI 1 Units by Misc.(Non-Drug; Combo Route) route as needed.  . Dulaglutide 1.5 MG/0.5ML SOPN Inject into the skin.  . DULoxetine (CYMBALTA) 60 MG capsule Take 60 mg by mouth 2 (two) times daily.    . fluticasone (FLONASE) 50 MCG/ACT nasal spray Place 2 sprays into both nostrils daily.  . folic acid (FOLVITE) 1 MG tablet Take 1 mg by mouth daily.    . furosemide (LASIX) 40 MG tablet Take 40 mg by mouth 2 (two) times daily.   Marland Kitchen. glucose blood (FREESTYLE INSULINX TEST) test strip Check BG 4 times/day.  Dx E11.65  . insulin aspart (NOVOLOG FLEXPEN) 100 UNIT/ML FlexPen USE AS DIRECTED BY YOUR PRESCRIBER  . Insulin Glargine (LANTUS SOLOSTAR) 100 UNIT/ML Solostar Pen Inject 80 Units into the skin 2 (two) times daily.   . isosorbide mononitrate (IMDUR) 30 MG 24 hr tablet Take 1 tablet (30 mg total) by mouth daily.  Marland Kitchen. levothyroxine (LEVOXYL) 200 MCG tablet TAKE 1 TABLET  DAILY AT 0600  . magnesium oxide (MAG-OX) 400 MG tablet Take by mouth.  . metFORMIN (GLUCOPHAGE-XR) 500 MG 24 hr tablet Take 500 mg by mouth 2 (two) times daily.   . nitroGLYCERIN (NITROSTAT) 0.4 MG SL tablet Place 0.4 mg under the tongue every 5 (five) minutes as needed.    Marland Kitchen. omeprazole (PRILOSEC) 20 MG capsule Take 40 mg by mouth 2 (two) times daily.    . potassium chloride SA (K-DUR,KLOR-CON) 20 MEQ tablet Take by mouth.  . pregabalin (LYRICA) 225 MG capsule Take 225 mg by mouth 2 (two) times daily.   Marland Kitchen. rOPINIRole (REQUIP) 1 MG tablet Take 1 mg by mouth 3 (three) times daily.  Marland Kitchen. spironolactone-hydrochlorothiazide (ALDACTAZIDE) 25-25 MG tablet TAKE 1 TABLET DAILY  . triamcinolone (KENALOG) 0.5 % cream Apply topically as needed.    . Vitamin D, Ergocalciferol, (DRISDOL) 50000 units CAPS capsule TAKE 1 CAPSULE EVERY 7 DAYS     Allergies:   Sulfamethoxazole, Actos [pioglitazone hydrochloride], Amlodipine, Amoxicillin, Ampicillin, Codeine, Fentanyl, Pioglitazone, and Sulfonamide derivatives   Social History   Socioeconomic History  . Marital status: Married    Spouse name: Not on file  . Number of children: Not on file  . Years of education: Not on file  . Highest education level: Not on file  Occupational History  . Not on file  Tobacco Use  . Smoking status: Former Smoker    Types: Cigarettes  . Smokeless tobacco: Never Used  Substance and Sexual Activity  . Alcohol use: Not Currently  . Drug use: Never  . Sexual activity: Not on file  Other Topics Concern  . Not on file  Social History Narrative  . Not on file   Social Determinants of Health   Financial Resource Strain:   . Difficulty of Paying Living Expenses:   Food Insecurity:   . Worried About Programme researcher, broadcasting/film/videounning Out of Food in the Last Year:   . Baristaan Out of Food in the Last Year:   Transportation Needs:   . Freight forwarderLack of Transportation (Medical):   Marland Kitchen. Lack of Transportation (Non-Medical):   Physical Activity:   . Days of Exercise  per Week:   . Minutes of Exercise per Session:   Stress:   . Feeling of Stress :   Social Connections:   . Frequency of Communication with Friends and Family:   . Frequency of Social Gatherings with Friends and Family:   . Attends Religious Services:   .  Active Member of Clubs or Organizations:   . Attends Banker Meetings:   Marland Kitchen Marital Status:      Family History: The patient's family history includes Diabetes in her mother; Heart attack in her father and mother; Thyroid disease in her mother. ROS:   Please see the history of present illness.    All 14 point review of systems negative except as described per history of present illness  EKGs/Labs/Other Studies Reviewed:    Stress test from 10/11/2019 showed:  Nuclear stress EF: 60%.  The left ventricular ejection fraction is normal (55-65%).  There was no ST segment deviation noted during stress.  Defect 1: There is a medium defect of moderate severity present in the basal inferior and mid inferior location.  Findings consistent with ischemia.  This is an intermediate risk study.   Echocardiogram done on 10/12/2019 showed: IMPRESSIONS    1. Left ventricular ejection fraction, by estimation, is 60 to 65%. The  left ventricle has normal function. The left ventricle has no regional  wall motion abnormalities. There is mild asymmetric left ventricular  hypertrophy of the posterior segment.  Left ventricular diastolic parameters are consistent with Grade II  diastolic dysfunction (pseudonormalization).  2. Right ventricular systolic function is normal. The right ventricular  size is normal.  3. The mitral valve is normal in structure. No evidence of mitral valve  regurgitation. No evidence of mitral stenosis.  4. The aortic valve is normal in structure. Aortic valve regurgitation is  not visualized. No aortic stenosis is present.  5. The inferior vena cava is normal in size with greater than 50%    respiratory variability, suggesting right atrial pressure of 3 mmHg   Cardiac catheterization from 06/27/2015 showed: Conclusions Diagnostic Procedure Summary 100% occluded RCA with rentrop 2-3 collaterals to the RCA from the LCA Mild to moderate disease in the LAD and LCX Normal LV function Diagnostic Procedure Recommendations Medical therapy  Signatures  Electronically signed by Therisa Doyne, MD(Diagnostic  Physician) on 06/27/2015 12:32  Recent Labs: No results found for requested labs within last 8760 hours.  Recent Lipid Panel    Component Value Date/Time   CHOL 201 (H) 03/10/2018 0937   TRIG 301 (H) 03/10/2018 0937   HDL 38 (L) 03/10/2018 0937   CHOLHDL 5.3 (H) 03/10/2018 0937   CHOLHDL 5.9 09/03/2009 0357   VLDL UNABLE TO CALCULATE IF TRIGLYCERIDE OVER 400 mg/dL 67/34/1937 9024   LDLCALC 103 (H) 03/10/2018 0937    Physical Exam:    VS:  BP (!) 146/66   Pulse 80   Ht 5\' 3"  (1.6 m)   Wt 205 lb (93 kg)   SpO2 94%   BMI 36.31 kg/m     Wt Readings from Last 3 Encounters:  10/17/19 205 lb (93 kg)  10/11/19 200 lb (90.7 kg)  09/20/19 200 lb 6.4 oz (90.9 kg)     GEN:  Well nourished, well developed in no acute distress HEENT: Normal NECK: No JVD; No carotid bruits LYMPHATICS: No lymphadenopathy CARDIAC: RRR, no murmurs, no rubs, no gallops RESPIRATORY:  Clear to auscultation without rales, wheezing or rhonchi  ABDOMEN: Soft, non-tender, non-distended MUSCULOSKELETAL:  No edema; No deformity  SKIN: Warm and dry LOWER EXTREMITIES: no swelling NEUROLOGIC:  Alert and oriented x 3 PSYCHIATRIC:  Normal affect   ASSESSMENT:    1. Coronary artery disease involving native coronary artery of native heart without angina pectoris   2. Essential hypertension   3. Abdominal aortic aneurysm without rupture (  HCC)   4. Dyslipidemia    PLAN:    In order of problems listed above:  7. Coronary artery disease stress test showing small area of ischemia involving  inferior wall which is with correlation with completely occluded right coronary artery.  Therefore from cardiac standpoint of view should be acceptable to proceed with surgery for her shoulder as scheduled she is probably at intermediate risk, therefore, we need to pay special attention to her hemodynamics.  We should avoid any significant hypotension.  I will not alter any of her medications today. 8. Essential hypertension blood pressure well controlled continue present management. 9. Abdominal arctic aneurysm measuring 3.5 cm we will continue monitoring 10. Dyslipidemia: On high intensity statin with last fasting lipid profile that I reviewed done by primary care physician from 11 March of this year showing LDL of 41 HDL 44 triglycerides 186.  This is acceptable cholesterol profile obviously triglycerides should be somewhat better.  Last hemoglobin A1c I see from the same day is 7.8.   Medication Adjustments/Labs and Tests Ordered: Current medicines are reviewed at length with the patient today.  Concerns regarding medicines are outlined above.  No orders of the defined types were placed in this encounter.  Medication changes: No orders of the defined types were placed in this encounter.   Signed, Georgeanna Lea, MD, [facility] 10/17/2019 12:03 PM    [facility]

## 2019-10-19 ENCOUNTER — Telehealth: Payer: Self-pay | Admitting: *Deleted

## 2019-10-19 NOTE — Telephone Encounter (Signed)
   Primary Cardiologist: No primary care provider on file.  Chart reviewed as part of pre-operative protocol coverage. Given past medical history and time since last visit, based on ACC/AHA guidelines, Deanna Dunn would be at acceptable risk for the planned procedure without further cardiovascular testing.   I will route this recommendation to the requesting party via Epic fax function and remove from pre-op pool.  Please call with questions.  [name]. [name] NP-C    10/19/2019, 1:54 PM [facility] [address] Office [phone] Fax [phone]

## 2019-10-19 NOTE — Telephone Encounter (Signed)
I would prefer not to discontinue aspirin for shoulder surgery.  She should be fine to proceed with surgery overall.  She does have known completely occluded right coronary artery with stable pattern

## 2019-10-19 NOTE — Telephone Encounter (Signed)
   [facility] Pre-operative Risk Assessment    Request for surgical clearance:  1. What type of surgery is being performed?  RIGHT SHOULDER ARTHROSCOPIC ROTATOR CUFF REPAIR   2. When is this surgery scheduled?  TBD    3. What type of clearance is required (medical clearance vs. Pharmacy clearance to hold med vs. Both)?  BOTH  4. Are there any medications that need to be held prior to surgery and how long?  ASPIRIN  5. Practice name and name of physician performing surgery?   [facility] / DR. YASTE   6. What is your office phone number [phone]    7.   What is your office fax number [phone]  8.   Anesthesia type (None, local, MAC, general) ?     Jeanann Lewandowsky 10/19/2019, 1:45 PM  _________________________________________________________________   (provider comments below)

## 2019-10-19 NOTE — Telephone Encounter (Signed)
   [facility] Pre-operative Risk Assessment    Request for surgical clearance:  1. What type of surgery is being performed?  COLONOSCOPY   2. When is this surgery scheduled?  11/01/2019   3. What type of clearance is required (medical clearance vs. Pharmacy clearance to hold med vs. Both)?  BOTH  4. Are there any medications that need to be held prior to surgery and how long? N/A PER SURGICAL CLEARANCE NOTE, PT CAN STAY ON ASPIRIN   5. Practice name and name of physician performing surgery?  [facility] / DR. Melina Copa   6. What is your office phone number [phone]    7.   What is your office fax number [phone]  8.   Anesthesia type (None, local, MAC, general) ?  PROPOFOL    Jeanann Lewandowsky 10/19/2019, 1:32 PM  _________________________________________________________________   (provider comments below)

## 2019-10-20 NOTE — Telephone Encounter (Signed)
   Primary Cardiologist: Gypsy Balsam, MD  Chart reviewed as part of pre-operative protocol coverage. Given past medical history and time since last visit, based on ACC/AHA guidelines, Sueko Dimichele would be at acceptable risk for the planned procedure without further cardiovascular testing.   Due to her coronary artery history, Dr. Bing Matter would like her to continue her aspirin through the surgery.  I will route this recommendation to the requesting party via Epic fax function and remove from pre-op pool.  Please call with questions.  Deanna Dunn. Juniel Groene NP-C    10/20/2019, 7:43 AM [facility] [address] Office [phone] Fax [phone]

## 2019-10-23 ENCOUNTER — Telehealth: Payer: Self-pay

## 2019-10-23 NOTE — Telephone Encounter (Signed)
Left message on patients voicemail to please return our call.   

## 2019-10-23 NOTE — Telephone Encounter (Signed)
-----   Message from Georgeanna Lea, MD sent at 10/22/2019  6:49 PM EDT ----- Echocardiogram showed normal left ventricle ejection fraction, diastolic dysfunction, overall looks good

## 2019-10-24 NOTE — Telephone Encounter (Signed)
Spoke with patient regarding results.  Patient verbalizes understanding and is agreeable to plan of care. Advised patient to call back with any issues or concerns.  

## 2019-10-26 ENCOUNTER — Other Ambulatory Visit: Payer: Medicare Other

## 2019-11-08 DIAGNOSIS — Z01818 Encounter for other preprocedural examination: Secondary | ICD-10-CM | POA: Diagnosis not present

## 2019-11-09 DIAGNOSIS — M961 Postlaminectomy syndrome, not elsewhere classified: Secondary | ICD-10-CM

## 2019-11-09 HISTORY — DX: Postlaminectomy syndrome, not elsewhere classified: M96.1

## 2019-12-14 DIAGNOSIS — G43001 Migraine without aura, not intractable, with status migrainosus: Secondary | ICD-10-CM

## 2019-12-14 HISTORY — DX: Migraine without aura, not intractable, with status migrainosus: G43.001

## 2019-12-21 ENCOUNTER — Ambulatory Visit: Payer: Medicare Other | Admitting: Cardiology

## 2020-01-16 ENCOUNTER — Ambulatory Visit (INDEPENDENT_AMBULATORY_CARE_PROVIDER_SITE_OTHER): Payer: Medicare Other

## 2020-01-16 ENCOUNTER — Encounter: Payer: Self-pay | Admitting: Cardiology

## 2020-01-16 ENCOUNTER — Other Ambulatory Visit: Payer: Self-pay

## 2020-01-16 ENCOUNTER — Ambulatory Visit (INDEPENDENT_AMBULATORY_CARE_PROVIDER_SITE_OTHER): Payer: Medicare Other | Admitting: Cardiology

## 2020-01-16 VITALS — BP 136/82 | HR 98 | Ht 63.0 in | Wt 204.2 lb

## 2020-01-16 DIAGNOSIS — I2510 Atherosclerotic heart disease of native coronary artery without angina pectoris: Secondary | ICD-10-CM

## 2020-01-16 DIAGNOSIS — J449 Chronic obstructive pulmonary disease, unspecified: Secondary | ICD-10-CM | POA: Diagnosis not present

## 2020-01-16 DIAGNOSIS — R002 Palpitations: Secondary | ICD-10-CM

## 2020-01-16 NOTE — Patient Instructions (Signed)
Medication Instructions:  Your physician recommends that you continue on your current medications as directed. Please refer to the Current Medication list given to you today.  *If you need a refill on your cardiac medications before your next appointment, please call your pharmacy*   Lab Work: None If you have labs (blood work) drawn today and your tests are completely normal, you will receive your results only by: MyChart Message (if you have MyChart) OR A paper copy in the mail If you have any lab test that is abnormal or we need to change your treatment, we will call you to review the results.   Testing/Procedures: A zio monitor was ordered today. It will remain on for 14 days. You will then return monitor and event diary in provided box. It takes 1-2 weeks for report to be downloaded and returned to us. We will call you with the results. If monitor falls off or has orange flashing light, please call Zio for further instructions.     Follow-Up: At [facility], you and your health needs are our priority.  As part of our continuing mission to provide you with exceptional heart care, we have created designated Provider Care Teams.  These Care Teams include your primary Cardiologist (physician) and Advanced Practice Providers (APPs -  Physician Assistants and Nurse Practitioners) who all work together to provide you with the care you need, when you need it.  We recommend signing up for the patient portal called "MyChart".  Sign up information is provided on this After Visit Summary.  MyChart is used to connect with patients for Virtual Visits (Telemedicine).  Patients are able to view lab/test results, encounter notes, upcoming appointments, etc.  Non-urgent messages can be sent to your provider as well.   To learn more about what you can do with MyChart, go to [email or web address].    Your next appointment:   3 month(s)  The format for your next appointment:   In  Person  Provider:   Robert Krasowski, MD    Other Instructions    

## 2020-01-16 NOTE — Progress Notes (Signed)
Cardiology Office Note:    Date:  01/16/2020   ID:  Deanna Dunn, DOB Jun 15, 1956, MRN [id number]  PCP:  Buckner Malta, MD  Cardiologist:  Gypsy Balsam, MD    Referring MD: Gordan Payment., MD   No chief complaint on file. It looks like got a stroke sometime ago  History of Present Illness:    Deanna Dunn is a 64 y.o. female with past medical history significant for coronary artery disease in 2008 she had PTCA and stenting done, also diabetes which is poorly controlled, dyslipidemia, essential hypertension.  Last cardiac catheterization was done in December 2016 which showed completely occluded right coronary artery with collateralization.  She was getting ready to have shoulder surgery stress test was done showed small ischemia involving inferior wall which correlate with history of completely occluded right coronary artery.  She was cleared for surgery, however surgery never happened.  She comes today to our office for follow-up.  She complained of having a lot of issues biggest problem appears to be back problem actually yesterday she did have some injections to her spine.  On top of that she did have a CT of her head done because of persistent headache and she was told to have old stroke.  She comes today to talk about that.  In the meantime yesterday she did have carotic ultrasounds which I did review at this study which did show less than 50% bilaterally stenosis.  I did review her CT of her head which showed chronic small vessel ischemic disease with new chronic lacunar infarct in the deep right cerebellar white matter.  She denies having any neurological symptoms..  Past Medical History:  Diagnosis Date  . Abdominal aortic aneurysm without rupture (HCC) 08/02/2015   Last Assessment & Plan:  Relevant Hx: Course: Daily Update: Today's Plan:this has been followed through cardio and she again is advised that she needs to stop smoking  Electronically signed by: Krystal Clark, NP 08/05/15 1019  . Asthma 08/02/2015   Last Assessment & Plan:  Relevant Hx: Course: Daily Update: Today's Plan:this is stable for her at this time  Electronically signed by: Krystal Clark, NP 08/05/15 1021  . CHEST PAIN-UNSPECIFIED 09/18/2009   Qualifier: Diagnosis of  By: Jolene Provost   . COPD (chronic obstructive pulmonary disease) with chronic bronchitis (HCC) 08/02/2015   Last Assessment & Plan:  Relevant Hx: Course: Daily Update: Today's Plan:stable for her as can be with her continued smoking not using any inhalers at this time  Electronically signed by: Krystal Clark, NP 08/05/15 1020  . CORONARY ARTERY DISEASE 03/28/2007   Qualifier: Diagnosis of  By: Charlsie Quest RMA, Lucy    . DDD (degenerative disc disease), lumbosacral 08/02/2015   Last Assessment & Plan:  Relevant Hx: Course: Daily Update: Today's Plan:chronic for her with pain management through in town provider   Electronically signed by: Krystal Clark, NP 08/05/15 1023  . DIABETES MELLITUS, TYPE I 03/28/2007   Qualifier: Diagnosis of  By: Charlsie Quest RMA, Lucy    . Diabetic autonomic neuropathy (HCC) 08/02/2015  . Diastolic heart failure    mild  . Dysfunction of both eustachian tubes 08/27/2017  . Dyslipidemia 11/30/2017  . 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 Sandria Manly for mental health in [place] she states and her abilify was increased to 10mg  though she does not know when  Electronically signed by: ,  NP 08/05/15 1025  . Eczema of both external ears 05/11/2017  . Fatigue 08/05/2015   Last Assessment & Plan:  Relevant Hx: Course: Daily Update: Today's Plan:chronic to some degree for her and tied to her multipharmacy and we discussed her smoking, her inactivity and she is aware of all of this  Electronically signed by: Krystal ClarkMelissa Joyce Brown-Patram, NP 08/05/15 1026  . GERD 10/31/2009    Qualifier: Diagnosis of  By: Excell Seltzerooper, MD, Vale HavenMichael David   . High risk medication use 02/01/2017  . Hypercalcemia 08/02/2015  . HYPERLIPIDEMIA 03/28/2007   Qualifier: Diagnosis of  By: Tyrone AppleBrand RMA, Lucy    . HYPERTENSION 03/28/2007   Qualifier: Diagnosis of  By: Tyrone AppleBrand RMA, Lucy    . HYPOTHYROIDISM 03/28/2007   Qualifier: Diagnosis of  By: Tyrone AppleBrand RMA, Lucy    . Long term (current) use of insulin (HCC) 08/02/2015   Last Assessment & Plan:  Relevant Hx: Course: Daily Update: Today's Plan:follows through endocrine  Electronically signed by: Krystal ClarkMelissa Joyce Brown-Patram, NP 08/05/15 1025  . Major depressive disorder, recurrent, mild (HCC) 08/01/2015  . Obesity   . Obesity (BMI 30-39.9) 10/03/2015  . Obstructive sleep apnea   . OSA on CPAP 08/02/2015   Last Assessment & Plan:  Relevant Hx: Course: Daily Update: Today's Plan:she is wearing her bipap/cpap and follows with Dr.Chodri for this not sure of her settings but she is compliant with use nightly  Electronically signed by: Krystal ClarkMelissa Joyce Brown-Patram, NP 08/05/15 1020  . Osteoarthritis, generalized 08/02/2015   Last Assessment & Plan:  Relevant Hx: Course: Daily Update: Today's Plan:weight complicates her picture for her and she is having difficulty with getting around but using her walker for support   Electronically signed by: Krystal ClarkMelissa Joyce Brown-Patram, NP 08/05/15 1024  . OVERWEIGHT/OBESITY 09/18/2009   Qualifier: Diagnosis of  By: Jolene ProvostLenze, PA-C, Michele Mullaney   . Restless leg syndrome 02/01/2017  . TOBACCO ABUSE 10/31/2009   Qualifier: Diagnosis of  By: Excell Seltzerooper, MD, Vale HavenMichael David   . Tobacco user   . Urinary incontinence 11/20/2016  . Vitamin B12 deficiency 02/03/2017  . Vitamin D deficiency 08/02/2015   Last Assessment & Plan:  Relevant Hx: Course: Daily Update: Today's Plan:she is taking this weekly  Electronically signed by: Krystal ClarkMelissa Joyce Brown-Patram, NP 08/05/15 1025    Past Surgical History:  Procedure Laterality Date  . CARDIAC CATHETERIZATION  08/2009    instent restenosis and thrombosis in RCA, 70% mid LAD stenosis  . CHOLECYSTECTOMY    . CORONARY ANGIOPLASTY WITH STENT PLACEMENT  08/2009   RCA: 3.5 X 23 mm Promus DES.  Marland Kitchen. ENDOSCOPIC HEMILAMINOTOMY W/ DISCECTOMY LUMBAR    . HEMIARTHROPLASTY HIP Right 07/09/2015   Displaced intertrochanteric fx of rt femur with progression to the hemiarthroplasty  . TUBAL LIGATION      Current Medications: Current Meds  Medication Sig  . Acetaminophen (TYLENOL ARTHRITIS PAIN PO) Take 650 mg by mouth at bedtime. To take 2 tablets every night at bedtime.   Marland Kitchen. ADVAIR HFA 115-21 MCG/ACT inhaler Inhale 2 puffs into the lungs daily.  . Albuterol Sulfate (PROAIR HFA IN) Inhale 2 puffs into the lungs every 4 (four) hours as needed.    Marland Kitchen. alendronate (FOSAMAX) 70 MG tablet Take 70 mg by mouth once a week.  . ARIPiprazole (ABILIFY) 5 MG tablet Take 2 a day  . aspirin 81 MG tablet Take 81 mg by mouth daily.    Marland Kitchen. atorvastatin (LIPITOR) 80 MG tablet Take 80 mg by mouth daily.    .Marland Kitchen  Blood Glucose Monitoring Suppl (GLUCOCOM BLOOD GLUCOSE MONITOR) DEVI by Misc.(Non-Drug; Combo Route) route.  Marland Kitchen buPROPion (WELLBUTRIN SR) 150 MG 12 hr tablet Take 150 mg by mouth 2 (two) times daily.    . calcium carbonate (OS-CAL) 600 MG TABS Take 600 mg by mouth 2 (two) times daily with a meal.    . cholecalciferol (VITAMIN D) 1000 UNITS tablet Take 1,000 Units by mouth 2 (two) times daily.    . cloNIDine (CATAPRES) 0.2 MG tablet Take  1 tab twice a day (dose increased)  . Continuous Blood Gluc Receiver (FREESTYLE LIBRE 14 DAY READER) DEVI 1 Units by Misc.(Non-Drug; Combo Route) route as needed.  . Dulaglutide 1.5 MG/0.5ML SOPN Inject into the skin.  . DULoxetine (CYMBALTA) 60 MG capsule Take 60 mg by mouth 2 (two) times daily.    Dorise Hiss (AIMOVIG, 140 MG DOSE,) 70 MG/ML SOAJ Inject into the skin. Once Month  . fenofibrate 160 MG tablet Take 160 mg by mouth daily.    . fluticasone (FLONASE) 50 MCG/ACT nasal spray Place 2 sprays into  both nostrils daily.  . folic acid (FOLVITE) 1 MG tablet Take 1 mg by mouth daily.    . furosemide (LASIX) 40 MG tablet Take 40 mg by mouth 2 (two) times daily.   Marland Kitchen glucose blood (FREESTYLE INSULINX TEST) test strip Check BG 4 times/day.  Dx E11.65  . insulin aspart (NOVOLOG FLEXPEN) 100 UNIT/ML FlexPen USE AS DIRECTED BY YOUR PRESCRIBER  . Insulin Glargine (LANTUS SOLOSTAR) 100 UNIT/ML Solostar Pen Inject 80 Units into the skin 2 (two) times daily.   . insulin lispro (HUMALOG) 100 UNIT/ML injection Inject 22 Units into the skin 3 (three) times daily before meals.   . isosorbide mononitrate (IMDUR) 30 MG 24 hr tablet Take 1 tablet (30 mg total) by mouth daily.  Marland Kitchen levothyroxine (LEVOXYL) 200 MCG tablet TAKE 1 TABLET DAILY AT 0600  . magnesium oxide (MAG-OX) 400 MG tablet Take by mouth.  . metFORMIN (GLUCOPHAGE-XR) 500 MG 24 hr tablet Take 500 mg by mouth 2 (two) times daily.   . metoprolol succinate (TOPROL-XL) 100 MG 24 hr tablet Take by mouth.  . nitroGLYCERIN (NITROSTAT) 0.4 MG SL tablet Place 0.4 mg under the tongue every 5 (five) minutes as needed.    Marland Kitchen omeprazole (PRILOSEC) 20 MG capsule Take 40 mg by mouth 2 (two) times daily.    . ondansetron (ZOFRAN) 8 MG tablet Take 8 mg by mouth every 8 (eight) hours as needed.  . potassium chloride SA (K-DUR,KLOR-CON) 20 MEQ tablet Take by mouth.  . pregabalin (LYRICA) 225 MG capsule Take 225 mg by mouth 2 (two) times daily.   Marland Kitchen rOPINIRole (REQUIP) 1 MG tablet Take 1 mg by mouth 3 (three) times daily.  Marland Kitchen spironolactone-hydrochlorothiazide (ALDACTAZIDE) 25-25 MG tablet TAKE 1 TABLET DAILY  . triamcinolone (KENALOG) 0.5 % cream Apply topically as needed.    . Vitamin D, Ergocalciferol, (DRISDOL) 50000 units CAPS capsule TAKE 1 CAPSULE EVERY 7 DAYS     Allergies:   Sulfamethoxazole, Actos [pioglitazone hydrochloride], Amlodipine, Amoxicillin, Ampicillin, Codeine, Fentanyl, Pioglitazone, and Sulfonamide derivatives   Social History   Socioeconomic  History  . Marital status: Married    Spouse name: Not on file  . Number of children: Not on file  . Years of education: Not on file  . Highest education level: Not on file  Occupational History  . Not on file  Tobacco Use  . Smoking status: Former Smoker    Types: Cigarettes  .  Smokeless tobacco: Never Used  Vaping Use  . Vaping Use: Former  Substance and Sexual Activity  . Alcohol use: Not Currently  . Drug use: Never  . Sexual activity: Not on file  Other Topics Concern  . Not on file  Social History Narrative  . Not on file   Social Determinants of Health   Financial Resource Strain:   . Difficulty of Paying Living Expenses:   Food Insecurity:   . Worried About Programme researcher, broadcasting/film/video in the Last Year:   . Barista in the Last Year:   Transportation Needs:   . Freight forwarder (Medical):   Marland Kitchen Lack of Transportation (Non-Medical):   Physical Activity:   . Days of Exercise per Week:   . Minutes of Exercise per Session:   Stress:   . Feeling of Stress :   Social Connections:   . Frequency of Communication with Friends and Family:   . Frequency of Social Gatherings with Friends and Family:   . Attends Religious Services:   . Active Member of Clubs or Organizations:   . Attends Banker Meetings:   Marland Kitchen Marital Status:      Family History: The patient's family history includes Diabetes in her mother; Heart attack in her father and mother; Thyroid disease in her mother. ROS:   Please see the history of present illness.    All 14 point review of systems negative except as described per history of present illness  EKGs/Labs/Other Studies Reviewed:      Recent Labs: No results found for requested labs within last 8760 hours.  Recent Lipid Panel    Component Value Date/Time   CHOL 201 (H) 03/10/2018 0937   TRIG 301 (H) 03/10/2018 0937   HDL 38 (L) 03/10/2018 0937   CHOLHDL 5.3 (H) 03/10/2018 0937   CHOLHDL 5.9 09/03/2009 0357   VLDL UNABLE  TO CALCULATE IF TRIGLYCERIDE OVER 400 mg/dL 61/60/7371 0626   LDLCALC 103 (H) 03/10/2018 0937    Physical Exam:    VS:  BP 136/82 (BP Location: Left Arm, Patient Position: Sitting, Cuff Size: Normal)   Pulse 98   Ht 5\' 3"  (1.6 m)   Wt 204 lb 3.2 oz (92.6 kg)   SpO2 90%   BMI 36.17 kg/m     Wt Readings from Last 3 Encounters:  01/16/20 204 lb 3.2 oz (92.6 kg)  10/17/19 205 lb (93 kg)  10/11/19 200 lb (90.7 kg)     GEN:  Well nourished, well developed in no acute distress HEENT: Normal NECK: No JVD; No carotid bruits LYMPHATICS: No lymphadenopathy CARDIAC: RRR, no murmurs, no rubs, no gallops RESPIRATORY:  Clear to auscultation without rales, wheezing or rhonchi  ABDOMEN: Soft, non-tender, non-distended MUSCULOSKELETAL:  No edema; No deformity  SKIN: Warm and dry LOWER EXTREMITIES: no swelling NEUROLOGIC:  Alert and oriented x 3 PSYCHIATRIC:  Normal affect   ASSESSMENT:    1. Coronary artery disease involving native coronary artery of native heart without angina pectoris   2. COPD (chronic obstructive pulmonary disease) with chronic bronchitis (HCC)   3. Palpitations    PLAN:    In order of problems listed above:  1. Coronary disease.  Stable from that point review she does have completely occluded right coronary artery with small area of ischemia on the stress test but asymptomatic at the same time.  We will continue conservative approach and risk factors modifications. 2. COPD: Noted followed by internal medicine team. 3. History  of CVA, does have lacunar strokes.  Does less likely to be embolic in origin, those are related to atherosclerosis.  We did look at her carotid ultrasounds which did not show significant stenosis, 50% bilaterally, that does not require any intervention, she is going to wear Zio patch however again lacunar strokes are usually not embolic in origin.  The key is risk factors modification termination of smoking proper diabetes management as well as  cholesterol management. 4. Diabetes: Followed by internal medicine team.  She does have a new doctor and there is a hope that her diabetes will be better controlled now. 5. Dyslipidemia: She is on high intensity statin which I will continue.  Fasting lipid profile will be rechecked.   Medication Adjustments/Labs and Tests Ordered: Current medicines are reviewed at length with the patient today.  Concerns regarding medicines are outlined above.  Orders Placed This Encounter  Procedures  . LONG TERM MONITOR (3-14 DAYS)   Medication changes: No orders of the defined types were placed in this encounter.   Signed, Georgeanna Lea, MD, [facility] 01/16/2020 11:53 AM    [facility]

## 2020-01-24 DIAGNOSIS — R4189 Other symptoms and signs involving cognitive functions and awareness: Secondary | ICD-10-CM | POA: Insufficient documentation

## 2020-01-24 DIAGNOSIS — M47812 Spondylosis without myelopathy or radiculopathy, cervical region: Secondary | ICD-10-CM

## 2020-01-24 HISTORY — DX: Spondylosis without myelopathy or radiculopathy, cervical region: M47.812

## 2020-01-24 HISTORY — DX: Other symptoms and signs involving cognitive functions and awareness: R41.89

## 2020-01-28 DIAGNOSIS — E010 Iodine-deficiency related diffuse (endemic) goiter: Secondary | ICD-10-CM

## 2020-01-28 HISTORY — DX: Iodine-deficiency related diffuse (endemic) goiter: E01.0

## 2020-02-29 ENCOUNTER — Telehealth: Payer: Self-pay | Admitting: Emergency Medicine

## 2020-02-29 NOTE — Telephone Encounter (Signed)
   Primary Cardiologist: Gypsy Balsam, MD  Deanna Dunn is a 64 year old female with past medical history of CAD with known occluded RCA with collateralization on the last cath in December 2016, hypertension, hyperlipidemia, hypothyroidism, obesity and obstructive sleep apnea on CPAP therapy.  She underwent Myoview in April 2021 as part of his cardiac clearance prior to shoulder surgery.  Myoview came back intermediate risk study with medium defect of moderate severity in the basal inferior and mid inferior location consistent with ischemia, EF 60%.  This was later reviewed by Dr. Bing Matter without there was actually a small area of ischemia however the area correlate with occluded RCA.  Given the fact patient was asymptomatic at the time, she was cleared for his shoulder surgery.  However, she never went through with the surgery.  Now she requires neck surgery.  Surgeons office did not specify any need to hold aspirin.  Overall risk with neck surgery is higher than the previous shoulder surgery.  I spoke with the patient, she denies any recent chest pain or worsening shortness of breath than usual.  Overall, she is feeling well, however she cannot accomplish more than 4 METS of activity.  This is her baseline.  I will check with Dr. Bing Matter to make sure he is okay with the patient to proceed with neck surgery given no new cardiac issues.  Dr. Bing Matter, please forward to your response to P CV DIV PREOP.  Thank you  Azalee Course PA-C  [place], [place] 02/29/2020, 4:57 PM

## 2020-02-29 NOTE — Telephone Encounter (Signed)
° °  [facility] Pre-operative Risk Assessment    HEARTCARE STAFF: - Please ensure there is not already an duplicate clearance open for this procedure. - Under Visit Info/Reason for Call, type in Other and utilize the format Clearance MM/DD/YY or Clearance TBD. Do not use dashes or single digits. - If request is for dental extraction, please clarify the # of teeth to be extracted.  Request for surgical clearance:  1. What type of surgery is being performed?  Cervical discectomy with interbody fusion and possible cervical disc arthroplasty and other procedures as indicated.    2. When is this surgery scheduled? TBD   3. What type of clearance is required (medical clearance vs. Pharmacy clearance to hold med vs. Both)? Medical   4. Are there any medications that need to be held prior to surgery and how long? None specified    5. Practice name and name of physician performing surgery? Lanham Durrani,MD    6. What is the office phone number? [phone]   7.   What is the office fax number? [phone]  8.   Anesthesia type (None, local, MAC, general) ? General    Deanna Dunn Deanna Dunn 02/29/2020, 10:13 AM  _________________________________________________________________   (provider comments below)

## 2020-03-01 NOTE — Telephone Encounter (Signed)
   Primary Cardiologist: Gypsy Balsam, MD  Please see recommendations below from Dr. Bing Matter regarding surgery.   Please call with questions.  Tereso Newcomer, PA-C 03/01/2020, 1:06 PM

## 2020-03-01 NOTE — Telephone Encounter (Signed)
Note faxed to surgeon's office. Note will be removed from [address] [place], [place]  1:09 PM 03/01/2020

## 2020-03-01 NOTE — Telephone Encounter (Signed)
She should be good to proceed with surgery as scheduled

## 2020-03-05 DIAGNOSIS — R110 Nausea: Secondary | ICD-10-CM

## 2020-03-05 DIAGNOSIS — I701 Atherosclerosis of renal artery: Secondary | ICD-10-CM

## 2020-03-05 DIAGNOSIS — J9611 Chronic respiratory failure with hypoxia: Secondary | ICD-10-CM

## 2020-03-05 HISTORY — DX: Chronic respiratory failure with hypoxia: J96.11

## 2020-03-05 HISTORY — DX: Atherosclerosis of renal artery: I70.1

## 2020-03-05 HISTORY — DX: Nausea: R11.0

## 2020-03-08 DIAGNOSIS — I739 Peripheral vascular disease, unspecified: Secondary | ICD-10-CM

## 2020-03-08 HISTORY — DX: Peripheral vascular disease, unspecified: I73.9

## 2020-03-12 DIAGNOSIS — G8929 Other chronic pain: Secondary | ICD-10-CM

## 2020-03-12 HISTORY — DX: Other chronic pain: G89.29

## 2020-04-18 ENCOUNTER — Other Ambulatory Visit: Payer: Self-pay

## 2020-04-18 DIAGNOSIS — G4733 Obstructive sleep apnea (adult) (pediatric): Secondary | ICD-10-CM | POA: Insufficient documentation

## 2020-04-18 DIAGNOSIS — E669 Obesity, unspecified: Secondary | ICD-10-CM | POA: Insufficient documentation

## 2020-04-19 ENCOUNTER — Ambulatory Visit (INDEPENDENT_AMBULATORY_CARE_PROVIDER_SITE_OTHER): Payer: Medicare Other | Admitting: Cardiology

## 2020-04-19 ENCOUNTER — Other Ambulatory Visit: Payer: Self-pay

## 2020-04-19 ENCOUNTER — Encounter: Payer: Self-pay | Admitting: Cardiology

## 2020-04-19 VITALS — BP 120/58 | HR 86 | Ht 63.0 in | Wt 191.0 lb

## 2020-04-19 DIAGNOSIS — Z23 Encounter for immunization: Secondary | ICD-10-CM

## 2020-04-19 DIAGNOSIS — I739 Peripheral vascular disease, unspecified: Secondary | ICD-10-CM | POA: Diagnosis not present

## 2020-04-19 DIAGNOSIS — J449 Chronic obstructive pulmonary disease, unspecified: Secondary | ICD-10-CM

## 2020-04-19 DIAGNOSIS — I714 Abdominal aortic aneurysm, without rupture, unspecified: Secondary | ICD-10-CM

## 2020-04-19 DIAGNOSIS — I2510 Atherosclerotic heart disease of native coronary artery without angina pectoris: Secondary | ICD-10-CM | POA: Diagnosis not present

## 2020-04-19 NOTE — Addendum Note (Signed)
Addended by: Baird Lyons on: 04/19/2020 01:49 PM   Modules accepted: Orders

## 2020-04-19 NOTE — Progress Notes (Signed)
Cardiology Office Note:    Date:  04/19/2020   ID:  Deanna CageCynthia Depolo, DOB 06/23/1956, MRN [id number]  PCP:  Patient, No Pcp Per  Cardiologist:  Gypsy Balsamobert Quiera Diffee, MD    Referring MD: Buckner MaltaBurgart, Jennifer, MD   Chief Complaint  Patient presents with  . Follow-up  Doing fine  History of Present Illness:    Deanna Dunn is a 64 y.o. female   with past medical history significant for coronary artery disease in 2008 she had PTCA and stenting done, also diabetes which is poorly controlled, dyslipidemia, essential hypertension.  Last cardiac catheterization was done in December 2016 which showed completely occluded right coronary artery with collateralization.  She was getting ready to have shoulder surgery stress test was done showed small ischemia involving inferior wall which correlate with history of completely occluded right coronary artery.  She was cleared for surgery, however surgery never happened.   Comes today 2 months of follow-up overall seems to be doing well.  Denies have any chest pain tightness squeezing pressure burning chest still have a problem with her right leg and her right leg is in the boot.  She complained of having pain in the neck as well as shoulder surgery is still contemplating but no fixed date has been assigned yet.  The key was to improve her diabetes last hemoglobin A1c was more than 8.  Past Medical History:  Diagnosis Date  . Abdominal aortic aneurysm without rupture (HCC) 08/02/2015   Last Assessment & Plan:  Relevant Hx: Course: Daily Update: Today's Plan:this has been followed through cardio and she again is advised that she needs to stop smoking  Electronically signed by: Krystal ClarkMelissa Joyce Brown-Patram, NP 08/05/15 1019  . Asthma 08/02/2015   Last Assessment & Plan:  Relevant Hx: Course: Daily Update: Today's Plan:this is stable for her at this time  Electronically signed by: Krystal ClarkMelissa Joyce Brown-Patram, NP 08/05/15 1021  . Cervical spondylosis 01/24/2020  . CHEST  PAIN-UNSPECIFIED 09/18/2009   Qualifier: Diagnosis of  By: Jolene ProvostLenze, PA-C, Michele Mullaney   . Chronic respiratory failure with hypoxia (HCC) 03/05/2020  . Chronic SI joint pain 03/12/2020   Formatting of this note might be different from the original. Added automatically from request for surgery [id number]  . Class 1 drug-induced obesity with serious comorbidity and body mass index (BMI) of 33.0 to 33.9 in adult 10/03/2015  . Cognitive decline 01/24/2020  . Compression fracture of lumbar vertebra with routine healing 06/15/2019  . COPD (chronic obstructive pulmonary disease) with chronic bronchitis (HCC) 08/02/2015   Last Assessment & Plan:  Relevant Hx: Course: Daily Update: Today's Plan:stable for her as can be with her continued smoking not using any inhalers at this time  Electronically signed by: Krystal ClarkMelissa Joyce Brown-Patram, NP 08/05/15 1020  . CORONARY ARTERY DISEASE 03/28/2007   Qualifier: Diagnosis of  By: Charlsie QuestBrand RMA, Lucy    . Coronary artery disease    Occluded RCA 2016  . Coronary atherosclerosis 03/28/2007   Qualifier: Diagnosis of  By: Charlsie QuestBrand RMA, Joannie SpringsLucy    Formatting of this note might be different from the original. Multivessel disease. 3 stents.    Last cath 2016 Medical managmetnt.  100% RCA.  Cephus [place]. [place].  Abnormal ST 2021. Area of ischemia. EF okay.  Last Assessment & Plan:  Formatting of this note might be different from the original. Relevant Hx: Course: Daily Update: Today's Plan:she is followi  . DDD (degenerative disc disease), lumbosacral 08/02/2015   Last Assessment & Plan:  Relevant Hx: Course:  Daily Update: Today's Plan:chronic for her with pain management through in town provider   Electronically signed by: Krystal Clark, NP 08/05/15 1023  . DIABETES MELLITUS, TYPE I 03/28/2007   Qualifier: Diagnosis of  By: Charlsie Quest RMA, Lucy    . Diabetic autonomic neuropathy (HCC) 08/02/2015  . Diastolic heart failure    mild  . Dysfunction of both eustachian tubes 08/27/2017  . Dyslipidemia  11/30/2017  . 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 Sandria Manly for mental health in [place] she states and her abilify was increased to 10mg  though she does not know when  Electronically signed by: , NP 08/05/15 1025  . Eczema of both external ears 05/11/2017  . Essential hypertension 03/28/2007   Qualifier: Diagnosis of  By: 03/30/2007 RMA, Charlsie Quest of this note might be different from the original. Overview:  Qualifier: Diagnosis of  By: Joannie Springs, Lucy  . Fatigue 08/05/2015   Last Assessment & Plan:  Relevant Hx: Course: Daily Update: Today's Plan:chronic to some degree for her and tied to her multipharmacy and we discussed her smoking, her inactivity and she is aware of all of this  Electronically signed by: 10/03/2015, NP 08/05/15 1026  . Gastroesophageal reflux disease 10/31/2009   Qualifier: Diagnosis of  By: 12/31/2009, MD, Excell Seltzer   Last Assessment & Plan:  Formatting of this note might be different from the original. Relevant Hx: Course: Daily Update: Today's Plan:this is stable for her at this time  Electronically signed by: Vale Haven, NP 08/05/15 1021  . GERD 10/31/2009   Qualifier: Diagnosis of  By: 12/31/2009, MD, Excell Seltzer   . High risk medication use 02/01/2017  . Hypercalcemia 08/02/2015  . HYPERLIPIDEMIA 03/28/2007   Qualifier: Diagnosis of  By: 03/30/2007, Lucy    . HYPERTENSION 03/28/2007   Qualifier: Diagnosis of  By: 03/30/2007, Lucy    . HYPOTHYROIDISM 03/28/2007   Qualifier: Diagnosis of  By: 03/30/2007, Lucy    . Hypothyroidism 03/28/2007   Qualifier: Diagnosis of  By: 03/30/2007    Last Assessment & Plan:  Formatting of this note might be different from the original. Relevant Hx: Course: Daily Update: Today's Plan:update her current level for her   Electronically signed by: Samara Snide, NP 08/05/15 1022  . Long term (current)  use of insulin (HCC) 08/02/2015   Last Assessment & Plan:  Relevant Hx: Course: Daily Update: Today's Plan:follows through endocrine  Electronically signed by: 09/30/2015, NP 08/05/15 1025  . Lumbar facet arthropathy 08/02/2019   Formatting of this note might be different from the original. Added automatically from request for surgery [phone]  . Major depressive disorder, recurrent, mild (HCC) 08/01/2015  . Migraine without aura and with status migrainosus, not intractable 12/14/2019  . Nausea 03/05/2020  . Obesity   . Obesity (BMI 30-39.9) 10/03/2015  . Obstructive sleep apnea   . OSA on CPAP 08/02/2015   Last Assessment & Plan:  Relevant Hx: Course: Daily Update: Today's Plan:she is wearing her bipap/cpap and follows with Dr.Chodri for this not sure of her settings but she is compliant with use nightly  Electronically signed by: 09/30/2015, NP 08/05/15 1020  . Osteoarthritis, generalized 08/02/2015   Last Assessment & Plan:  Relevant Hx: Course: Daily Update: Today's Plan:weight complicates her picture for her and she is having difficulty with getting around but  using her walker for support   Electronically signed by: Krystal Clark, NP 08/05/15 1024  . OVERWEIGHT/OBESITY 09/18/2009   Qualifier: Diagnosis of  By: Jolene Provost   . Post laminectomy syndrome 11/09/2019   Formatting of this note might be different from the original. Added automatically from request for surgery [id number]  . PVD (peripheral vascular disease) (HCC) 03/08/2020   Formatting of this note might be different from the original. Abnormal ABI bilaterally.  Moderate.  Marland Kitchen RAS (renal artery stenosis) (HCC) 03/05/2020   Formatting of this note might be different from the original. Reported in old imaging.  Marland Kitchen Restless leg syndrome 02/01/2017  . Thyroid disease 04/14/2012   Formatting of this note might be different from the original. hypothyroidism  . Thyromegaly 01/28/2020   Formatting of  this note might be different from the original. US showed enlargement, but no mass.  . TOBACCO ABUSE 10/31/2009   Qualifier: Diagnosis of  By: Excell Seltzer, MD, Vale Haven   . Tobacco abuse 10/31/2009   Qualifier: Diagnosis of  By: Excell Seltzer, MD, Vale Haven   . Tobacco user   . Type 2 diabetes mellitus with diabetic neuropathy, with long-term current use of insulin (HCC) 08/02/2015   Last Assessment & Plan:  Formatting of this note might be different from the original. Relevant Hx: Course: Daily Update: Today's Plan:chronic and being managed through endocrine for her and she has felt her readings were stable for the most part  Electronically signed by: Krystal Clark, NP 08/05/15 1023  . Urinary incontinence 11/20/2016  . Vitamin B12 deficiency 02/03/2017  . Vitamin D deficiency 08/02/2015   Last Assessment & Plan:  Relevant Hx: Course: Daily Update: Today's Plan:she is taking this weekly  Electronically signed by: Krystal Clark, NP 08/05/15 1025    Past Surgical History:  Procedure Laterality Date  . CARDIAC CATHETERIZATION  08/2009   instent restenosis and thrombosis in RCA, 70% mid LAD stenosis  . CHOLECYSTECTOMY    . CORONARY ANGIOPLASTY WITH STENT PLACEMENT  08/2009   RCA: 3.5 X 23 mm Promus DES.  Marland Kitchen ENDOSCOPIC HEMILAMINOTOMY W/ DISCECTOMY LUMBAR    . HEMIARTHROPLASTY HIP Right 07/09/2015   Displaced intertrochanteric fx of rt femur with progression to the hemiarthroplasty  . TUBAL LIGATION      Current Medications: Current Meds  Medication Sig  . Acetaminophen (TYLENOL ARTHRITIS PAIN PO) Take 650 mg by mouth at bedtime. To take 2 tablets every night at bedtime.   Marland Kitchen ADVAIR HFA 115-21 MCG/ACT inhaler Inhale 2 puffs into the lungs daily.  . Albuterol Sulfate (PROAIR HFA IN) Inhale 2 puffs into the lungs every 4 (four) hours as needed.    Marland Kitchen alendronate (FOSAMAX) 70 MG tablet Take 70 mg by mouth once a week.  . ARIPiprazole (ABILIFY) 5 MG tablet Take 2 a day  . aspirin  81 MG tablet Take 81 mg by mouth daily.    Marland Kitchen atorvastatin (LIPITOR) 80 MG tablet Take 80 mg by mouth daily.    . Blood Glucose Monitoring Suppl (GLUCOCOM BLOOD GLUCOSE MONITOR) DEVI by Misc.(Non-Drug; Combo Route) route.  . budesonide-formoterol (SYMBICORT) 160-4.5 MCG/ACT inhaler Inhale 2 puffs into the lungs.  Marland Kitchen buPROPion (WELLBUTRIN SR) 150 MG 12 hr tablet Take 150 mg by mouth 2 (two) times daily.    . calcium carbonate (OS-CAL) 600 MG TABS Take 600 mg by mouth 2 (two) times daily with a meal.    . cholecalciferol (VITAMIN D) 1000 UNITS tablet Take 1,000 Units by  mouth 2 (two) times daily.    . cloNIDine (CATAPRES) 0.3 MG tablet Take 0.3 mg by mouth 2 (two) times daily.  . Continuous Blood Gluc Receiver (FREESTYLE LIBRE 14 DAY READER) DEVI 1 Units by Misc.(Non-Drug; Combo Route) route as needed.  . donepezil (ARICEPT) 5 MG tablet Take 5 mg by mouth daily.  . DULoxetine (CYMBALTA) 60 MG capsule Take 60 mg by mouth 2 (two) times daily.    . empagliflozin (JARDIANCE) 10 MG TABS tablet Take 10 mg by mouth daily.  Dorise Hiss 70 MG/ML SOAJ Inject 70 mg into the skin every 30 (thirty) days.  Marland Kitchen ezetimibe (ZETIA) 10 MG tablet Take 1 tablet (10 mg total) by mouth daily.  . fenofibrate 160 MG tablet Take 160 mg by mouth daily.    . fluticasone (FLONASE) 50 MCG/ACT nasal spray Place 2 sprays into both nostrils daily.  . folic acid (FOLVITE) 1 MG tablet Take 1 mg by mouth daily.    . furosemide (LASIX) 40 MG tablet Take 40 mg by mouth 2 (two) times daily.   Marland Kitchen glucose blood (FREESTYLE INSULINX TEST) test strip Check BG 4 times/day.  Dx E11.65  . insulin aspart (NOVOLOG FLEXPEN) 100 UNIT/ML FlexPen USE AS DIRECTED BY YOUR PRESCRIBER  . Insulin Glargine (LANTUS SOLOSTAR) 100 UNIT/ML Solostar Pen Inject 80 Units into the skin 2 (two) times daily.   . insulin lispro (HUMALOG) 100 UNIT/ML injection Inject 22 Units into the skin 3 (three) times daily before meals.   . isosorbide mononitrate (IMDUR) 30 MG  24 hr tablet Take 1 tablet (30 mg total) by mouth daily.  Marland Kitchen lamoTRIgine (LAMICTAL) 25 MG tablet Take by mouth.  . levothyroxine (LEVOXYL) 200 MCG tablet TAKE 1 TABLET DAILY AT 0600  . magnesium oxide (MAG-OX) 400 MG tablet Take by mouth.  . metFORMIN (GLUCOPHAGE-XR) 500 MG 24 hr tablet Take 500 mg by mouth 2 (two) times daily.   . metoprolol succinate (TOPROL-XL) 100 MG 24 hr tablet Take by mouth.  . nitroGLYCERIN (NITROSTAT) 0.4 MG SL tablet Place 0.4 mg under the tongue every 5 (five) minutes as needed.    Marland Kitchen omeprazole (PRILOSEC) 20 MG capsule Take 40 mg by mouth 2 (two) times daily.    . ondansetron (ZOFRAN) 8 MG tablet Take 8 mg by mouth every 8 (eight) hours as needed.  . potassium chloride SA (K-DUR,KLOR-CON) 20 MEQ tablet Take by mouth.  . pregabalin (LYRICA) 225 MG capsule Take 225 mg by mouth 2 (two) times daily.   Marland Kitchen rOPINIRole (REQUIP) 1 MG tablet Take 1 mg by mouth 3 (three) times daily.  Marland Kitchen spironolactone-hydrochlorothiazide (ALDACTAZIDE) 25-25 MG tablet TAKE 1 TABLET DAILY  . triamcinolone (KENALOG) 0.5 % cream Apply topically as needed.    . TRULICITY 3 MG/0.5ML SOPN   . Vitamin D, Ergocalciferol, (DRISDOL) 50000 units CAPS capsule TAKE 1 CAPSULE EVERY 7 DAYS     Allergies:   Sulfamethoxazole, Actos [pioglitazone hydrochloride], Amlodipine, Amoxicillin, Ampicillin, Codeine, Fentanyl, Pioglitazone, and Sulfonamide derivatives   Social History   Socioeconomic History  . Marital status: Married    Spouse name: Not on file  . Number of children: Not on file  . Years of education: Not on file  . Highest education level: Not on file  Occupational History  . Not on file  Tobacco Use  . Smoking status: Former Smoker    Types: Cigarettes  . Smokeless tobacco: Never Used  Vaping Use  . Vaping Use: Former  Substance and Sexual Activity  .  Alcohol use: Not Currently  . Drug use: Never  . Sexual activity: Not on file  Other Topics Concern  . Not on file  Social History  Narrative  . Not on file   Social Determinants of Health   Financial Resource Strain:   . Difficulty of Paying Living Expenses: Not on file  Food Insecurity:   . Worried About Programme researcher, broadcasting/film/video in the Last Year: Not on file  . Ran Out of Food in the Last Year: Not on file  Transportation Needs:   . Lack of Transportation (Medical): Not on file  . Lack of Transportation (Non-Medical): Not on file  Physical Activity:   . Days of Exercise per Week: Not on file  . Minutes of Exercise per Session: Not on file  Stress:   . Feeling of Stress : Not on file  Social Connections:   . Frequency of Communication with Friends and Family: Not on file  . Frequency of Social Gatherings with Friends and Family: Not on file  . Attends Religious Services: Not on file  . Active Member of Clubs or Organizations: Not on file  . Attends Banker Meetings: Not on file  . Marital Status: Not on file     Family History: The patient's family history includes Diabetes in her mother; Heart attack in her father and mother; Thyroid disease in her mother. ROS:   Please see the history of present illness.    All 14 point review of systems negative except as described per history of present illness  EKGs/Labs/Other Studies Reviewed:      Recent Labs: No results found for requested labs within last 8760 hours.  Recent Lipid Panel    Component Value Date/Time   CHOL 201 (H) 03/10/2018 0937   TRIG 301 (H) 03/10/2018 0937   HDL 38 (L) 03/10/2018 0937   CHOLHDL 5.3 (H) 03/10/2018 0937   CHOLHDL 5.9 09/03/2009 0357   VLDL UNABLE TO CALCULATE IF TRIGLYCERIDE OVER 400 mg/dL 09/81/1914 7829   LDLCALC 103 (H) 03/10/2018 0937    Physical Exam:    VS:  BP (!) 120/58 (BP Location: Left Arm, Patient Position: Sitting, Cuff Size: Normal)   Pulse 86   Ht  (1.6 m)   Wt 191 lb (86.6 kg)   SpO2 99%   BMI 33.83 kg/m     Wt Readings from Last 3 Encounters:  04/19/20 191 lb (86.6 kg)    01/16/20 204 lb 3.2 oz (92.6 kg)  10/17/19 205 lb (93 kg)     GEN:  Well nourished, well developed in no acute distress HEENT: Normal NECK: No JVD; No carotid bruits LYMPHATICS: No lymphadenopathy CARDIAC: RRR, no murmurs, no rubs, no gallops RESPIRATORY:  Clear to auscultation without rales, wheezing or rhonchi  ABDOMEN: Soft, non-tender, non-distended MUSCULOSKELETAL:  No edema; No deformity  SKIN: Warm and dry LOWER EXTREMITIES: no swelling NEUROLOGIC:  Alert and oriented x 3 PSYCHIATRIC:  Normal affect   ASSESSMENT:    1. Coronary artery disease involving native coronary artery of native heart without angina pectoris   2. PVD (peripheral vascular disease) (HCC)   3. Abdominal aortic aneurysm without rupture (HCC)   4. COPD (chronic obstructive pulmonary disease) with chronic bronchitis (HCC)    PLAN:    In order of problems listed above:  1. Coronary disease stable from that point review on appropriate medications.  We will continue denies have any symptoms suggest reactivation of the problem. 2. Peripheral vascular disease with  a cardiac ultrasound showing less than 50% bilaterally, she did have abdominal arctic aneurysm measuring only 3.6 cm that was in September 2019, we will schedule her to have abdominal arctic ultrasound to check on aneurysm. 3. COPD: Followed by internal medicine team. 4. History of CVA small lacunar stroke noted on the CT.  Those strokes typically not embolic.  Those typically related to microvascular problem.  The key is to improve her blood pressure which is a very good, improved diabetes as well as cholesterol.  She is already on high intense statin from cholesterol side, her last fasting lipid profile in the computer is from 2019.  Will contact her primary care physician to get more updated cholesterol.  Diabetes obviously need to be improved.  Her primary care physician working hard to make it happen.   Medication Adjustments/Labs and Tests  Ordered: Current medicines are reviewed at length with the patient today.  Concerns regarding medicines are outlined above.  No orders of the defined types were placed in this encounter.  Medication changes: No orders of the defined types were placed in this encounter.   Signed, Georgeanna Lea, MD, [facility] 04/19/2020 1:23 PM    [facility]

## 2020-04-19 NOTE — Patient Instructions (Signed)
Medication Instructions:  Your physician recommends that you continue on your current medications as directed. Please refer to the Current Medication list given to you today.  *If you need a refill on your cardiac medications before your next appointment, please call your pharmacy*   Lab Work: None ordered   Testing/Procedures: Your physician has recommended you have an abdominal ultrasound   Follow-Up: At [facility], you and your health needs are our priority.  As part of our continuing mission to provide you with exceptional heart care, we have created designated Provider Care Teams.  These Care Teams include your primary Cardiologist (physician) and Advanced Practice Providers (APPs -  Physician Assistants and Nurse Practitioners) who all work together to provide you with the care you need, when you need it.  We recommend signing up for the patient portal called "MyChart".  Sign up information is provided on this After Visit Summary.  MyChart is used to connect with patients for Virtual Visits (Telemedicine).  Patients are able to view lab/test results, encounter notes, upcoming appointments, etc.  Non-urgent messages can be sent to your provider as well.   To learn more about what you can do with MyChart, go to [email or web address].    Your next appointment:   5 month(s)  The format for your next appointment:   In Person  Provider:   Gypsy Balsam, MD    Thank you for choosing [facility]!!

## 2020-04-30 ENCOUNTER — Telehealth: Payer: Self-pay | Admitting: Cardiology

## 2020-04-30 NOTE — Telephone Encounter (Signed)
Called and spoke to patient. She wants to know if she is still ok to have Cervical discectomy with interbody fusion and possible cervical disc arthroplasty and other procedures as indicated. She was cleared previously by Dr. Bing Matter but Wenda Low, NP wants to make sure he still clears her after her 04/19/20 visit. She would then like a note sent to her stating it is ok. Wenda Low, NP fax number is below. Will confirm with Dr. Bing Matter he is still ok for her to have this procedure.   More details on surgery clearance call from September.     Wenda Low, NP [phone]

## 2020-04-30 NOTE — Telephone Encounter (Signed)
Patient is calling to speak with Dr. Vanetta Shawl nurse in regards to an upcoming procedure. Please advise.

## 2020-05-01 NOTE — Telephone Encounter (Signed)
It is fine to proceed with surgery from cardiac standpoint of view

## 2020-05-01 NOTE — Telephone Encounter (Signed)
Routed phone note to Deanna Dunn where Dr. Bing Matter states patient is good to continue with procedure.

## 2020-05-08 ENCOUNTER — Ambulatory Visit (INDEPENDENT_AMBULATORY_CARE_PROVIDER_SITE_OTHER): Payer: Medicare Other

## 2020-05-08 ENCOUNTER — Other Ambulatory Visit: Payer: Self-pay

## 2020-05-08 DIAGNOSIS — I714 Abdominal aortic aneurysm, without rupture, unspecified: Secondary | ICD-10-CM

## 2020-05-08 NOTE — Progress Notes (Signed)
Abdominal aortic duplex exam performed.  Jimmy Lam Bjorklund RDCS, RVT 

## 2020-05-09 ENCOUNTER — Telehealth: Payer: Self-pay | Admitting: Cardiology

## 2020-05-09 NOTE — Telephone Encounter (Signed)
Fax sent at this time.  

## 2020-05-09 NOTE — Telephone Encounter (Signed)
    Jasmine December from [facility] calling, she is requesting for the last OV notes of the pt. She gave fax# [phone]

## 2020-07-10 DIAGNOSIS — I440 Atrioventricular block, first degree: Secondary | ICD-10-CM | POA: Diagnosis not present

## 2020-07-19 DIAGNOSIS — J9600 Acute respiratory failure, unspecified whether with hypoxia or hypercapnia: Secondary | ICD-10-CM | POA: Diagnosis not present

## 2020-08-27 DEATH — deceased

## 2020-09-17 ENCOUNTER — Ambulatory Visit: Payer: Medicare Other | Admitting: Cardiology
